# Patient Record
Sex: Female | Born: 1980 | Race: White | Hispanic: No | Marital: Single | Smoking: Never smoker
Health system: Southern US, Community
[De-identification: ages and names within clinical notes are randomized; demographics above are authoritative.]

## PROBLEM LIST (undated history)

## (undated) DIAGNOSIS — K819 Cholecystitis, unspecified: Secondary | ICD-10-CM

## (undated) DIAGNOSIS — N200 Calculus of kidney: Secondary | ICD-10-CM

## (undated) HISTORY — PX: TUBAL LIGATION: SHX77

## (undated) HISTORY — PX: LITHOTRIPSY: SUR834

---
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
## 2001-12-03 ENCOUNTER — Encounter: Payer: Self-pay | Admitting: Internal Medicine

## 2001-12-03 ENCOUNTER — Emergency Department (HOSPITAL_COMMUNITY): Admission: EM | Admit: 2001-12-03 | Discharge: 2001-12-03 | Payer: Self-pay | Admitting: Internal Medicine

## 2001-12-15 ENCOUNTER — Emergency Department (HOSPITAL_COMMUNITY): Admission: EM | Admit: 2001-12-15 | Discharge: 2001-12-15 | Payer: Self-pay | Admitting: Emergency Medicine

## 2002-02-25 ENCOUNTER — Encounter: Payer: Self-pay | Admitting: Emergency Medicine

## 2002-02-25 ENCOUNTER — Emergency Department (HOSPITAL_COMMUNITY): Admission: EM | Admit: 2002-02-25 | Discharge: 2002-02-25 | Payer: Self-pay | Admitting: Emergency Medicine

## 2002-09-24 ENCOUNTER — Emergency Department (HOSPITAL_COMMUNITY): Admission: EM | Admit: 2002-09-24 | Discharge: 2002-09-24 | Payer: Self-pay | Admitting: Emergency Medicine

## 2002-10-18 ENCOUNTER — Observation Stay (HOSPITAL_COMMUNITY): Admission: EM | Admit: 2002-10-18 | Discharge: 2002-10-19 | Payer: Self-pay | Admitting: Internal Medicine

## 2002-10-29 ENCOUNTER — Emergency Department (HOSPITAL_COMMUNITY): Admission: EM | Admit: 2002-10-29 | Discharge: 2002-10-29 | Payer: Self-pay | Admitting: Emergency Medicine

## 2002-11-19 ENCOUNTER — Emergency Department (HOSPITAL_COMMUNITY): Admission: EM | Admit: 2002-11-19 | Discharge: 2002-11-19 | Payer: Self-pay | Admitting: Emergency Medicine

## 2002-12-20 ENCOUNTER — Encounter: Payer: Self-pay | Admitting: *Deleted

## 2002-12-20 ENCOUNTER — Ambulatory Visit (HOSPITAL_COMMUNITY): Admission: RE | Admit: 2002-12-20 | Discharge: 2002-12-20 | Payer: Self-pay | Admitting: *Deleted

## 2003-01-14 ENCOUNTER — Observation Stay (HOSPITAL_COMMUNITY): Admission: AD | Admit: 2003-01-14 | Discharge: 2003-01-15 | Payer: Self-pay | Admitting: *Deleted

## 2003-02-07 ENCOUNTER — Ambulatory Visit (HOSPITAL_COMMUNITY): Admission: AD | Admit: 2003-02-07 | Discharge: 2003-02-07 | Payer: Self-pay | Admitting: *Deleted

## 2003-02-19 ENCOUNTER — Ambulatory Visit (HOSPITAL_COMMUNITY): Admission: RE | Admit: 2003-02-19 | Discharge: 2003-02-19 | Payer: Self-pay | Admitting: *Deleted

## 2003-02-19 ENCOUNTER — Encounter: Payer: Self-pay | Admitting: *Deleted

## 2003-03-31 ENCOUNTER — Emergency Department (HOSPITAL_COMMUNITY): Admission: EM | Admit: 2003-03-31 | Discharge: 2003-03-31 | Payer: Self-pay | Admitting: Emergency Medicine

## 2003-04-20 ENCOUNTER — Ambulatory Visit (HOSPITAL_COMMUNITY): Admission: AD | Admit: 2003-04-20 | Discharge: 2003-04-20 | Payer: Self-pay | Admitting: *Deleted

## 2003-04-29 ENCOUNTER — Inpatient Hospital Stay (HOSPITAL_COMMUNITY): Admission: RE | Admit: 2003-04-29 | Discharge: 2003-05-01 | Payer: Self-pay | Admitting: *Deleted

## 2003-07-06 ENCOUNTER — Ambulatory Visit (HOSPITAL_COMMUNITY): Admission: RE | Admit: 2003-07-06 | Discharge: 2003-07-06 | Payer: Self-pay | Admitting: *Deleted

## 2003-09-18 ENCOUNTER — Emergency Department (HOSPITAL_COMMUNITY): Admission: EM | Admit: 2003-09-18 | Discharge: 2003-09-18 | Payer: Self-pay | Admitting: Emergency Medicine

## 2004-02-22 ENCOUNTER — Emergency Department (HOSPITAL_COMMUNITY): Admission: EM | Admit: 2004-02-22 | Discharge: 2004-02-22 | Payer: Self-pay | Admitting: Emergency Medicine

## 2004-05-22 ENCOUNTER — Emergency Department (HOSPITAL_COMMUNITY): Admission: EM | Admit: 2004-05-22 | Discharge: 2004-05-22 | Payer: Self-pay | Admitting: Emergency Medicine

## 2004-10-23 ENCOUNTER — Emergency Department (HOSPITAL_COMMUNITY): Admission: EM | Admit: 2004-10-23 | Discharge: 2004-10-23 | Payer: Self-pay | Admitting: Emergency Medicine

## 2005-01-15 ENCOUNTER — Emergency Department (HOSPITAL_COMMUNITY): Admission: EM | Admit: 2005-01-15 | Discharge: 2005-01-15 | Payer: Self-pay | Admitting: *Deleted

## 2005-03-24 ENCOUNTER — Emergency Department (HOSPITAL_COMMUNITY): Admission: EM | Admit: 2005-03-24 | Discharge: 2005-03-24 | Payer: Self-pay | Admitting: Emergency Medicine

## 2005-04-08 ENCOUNTER — Emergency Department (HOSPITAL_COMMUNITY): Admission: EM | Admit: 2005-04-08 | Discharge: 2005-04-08 | Payer: Self-pay | Admitting: *Deleted

## 2005-05-25 ENCOUNTER — Emergency Department (HOSPITAL_COMMUNITY): Admission: EM | Admit: 2005-05-25 | Discharge: 2005-05-25 | Payer: Self-pay | Admitting: Emergency Medicine

## 2006-05-11 ENCOUNTER — Emergency Department (HOSPITAL_COMMUNITY): Admission: EM | Admit: 2006-05-11 | Discharge: 2006-05-11 | Payer: Self-pay | Admitting: Emergency Medicine

## 2006-07-08 ENCOUNTER — Emergency Department (HOSPITAL_COMMUNITY): Admission: EM | Admit: 2006-07-08 | Discharge: 2006-07-08 | Payer: Self-pay | Admitting: Emergency Medicine

## 2006-12-06 ENCOUNTER — Emergency Department (HOSPITAL_COMMUNITY): Admission: EM | Admit: 2006-12-06 | Discharge: 2006-12-06 | Payer: Self-pay | Admitting: Emergency Medicine

## 2007-03-20 ENCOUNTER — Emergency Department (HOSPITAL_COMMUNITY): Admission: EM | Admit: 2007-03-20 | Discharge: 2007-03-20 | Payer: Self-pay | Admitting: Emergency Medicine

## 2007-07-30 ENCOUNTER — Emergency Department (HOSPITAL_COMMUNITY): Admission: EM | Admit: 2007-07-30 | Discharge: 2007-07-30 | Payer: Self-pay | Admitting: Emergency Medicine

## 2007-08-06 ENCOUNTER — Emergency Department (HOSPITAL_COMMUNITY): Admission: EM | Admit: 2007-08-06 | Discharge: 2007-08-07 | Payer: Self-pay | Admitting: Emergency Medicine

## 2007-08-15 ENCOUNTER — Emergency Department (HOSPITAL_COMMUNITY): Admission: EM | Admit: 2007-08-15 | Discharge: 2007-08-15 | Payer: Self-pay | Admitting: Emergency Medicine

## 2007-12-28 ENCOUNTER — Emergency Department (HOSPITAL_COMMUNITY): Admission: EM | Admit: 2007-12-28 | Discharge: 2007-12-28 | Payer: Self-pay | Admitting: Emergency Medicine

## 2008-01-13 ENCOUNTER — Emergency Department (HOSPITAL_COMMUNITY): Admission: EM | Admit: 2008-01-13 | Discharge: 2008-01-14 | Payer: Self-pay | Admitting: Emergency Medicine

## 2008-03-02 ENCOUNTER — Emergency Department (HOSPITAL_COMMUNITY): Admission: EM | Admit: 2008-03-02 | Discharge: 2008-03-02 | Payer: Self-pay | Admitting: Emergency Medicine

## 2008-06-14 ENCOUNTER — Emergency Department (HOSPITAL_COMMUNITY): Admission: EM | Admit: 2008-06-14 | Discharge: 2008-06-14 | Payer: Self-pay | Admitting: Emergency Medicine

## 2008-09-03 ENCOUNTER — Emergency Department (HOSPITAL_COMMUNITY): Admission: EM | Admit: 2008-09-03 | Discharge: 2008-09-03 | Payer: Self-pay | Admitting: Emergency Medicine

## 2009-03-15 ENCOUNTER — Emergency Department (HOSPITAL_COMMUNITY): Admission: EM | Admit: 2009-03-15 | Discharge: 2009-03-15 | Payer: Self-pay | Admitting: Emergency Medicine

## 2009-07-18 ENCOUNTER — Emergency Department (HOSPITAL_COMMUNITY): Admission: EM | Admit: 2009-07-18 | Discharge: 2009-07-18 | Payer: Self-pay | Admitting: Emergency Medicine

## 2010-02-19 ENCOUNTER — Emergency Department (HOSPITAL_COMMUNITY): Admission: EM | Admit: 2010-02-19 | Discharge: 2010-02-19 | Payer: Self-pay | Admitting: Emergency Medicine

## 2010-02-28 ENCOUNTER — Ambulatory Visit (HOSPITAL_COMMUNITY): Admission: RE | Admit: 2010-02-28 | Discharge: 2010-02-28 | Payer: Self-pay | Admitting: Urology

## 2010-03-05 ENCOUNTER — Emergency Department (HOSPITAL_COMMUNITY): Admission: EM | Admit: 2010-03-05 | Discharge: 2010-03-05 | Payer: Self-pay | Admitting: Emergency Medicine

## 2010-03-07 ENCOUNTER — Emergency Department (HOSPITAL_COMMUNITY): Admission: EM | Admit: 2010-03-07 | Discharge: 2010-03-07 | Payer: Self-pay | Admitting: Emergency Medicine

## 2010-03-10 ENCOUNTER — Ambulatory Visit (HOSPITAL_COMMUNITY): Admission: RE | Admit: 2010-03-10 | Discharge: 2010-03-10 | Payer: Self-pay | Admitting: Urology

## 2010-03-15 ENCOUNTER — Emergency Department (HOSPITAL_COMMUNITY): Admission: EM | Admit: 2010-03-15 | Discharge: 2010-03-16 | Payer: Self-pay | Admitting: Emergency Medicine

## 2010-03-19 ENCOUNTER — Emergency Department (HOSPITAL_COMMUNITY): Admission: EM | Admit: 2010-03-19 | Discharge: 2010-03-19 | Payer: Self-pay | Admitting: Emergency Medicine

## 2010-04-23 ENCOUNTER — Ambulatory Visit (HOSPITAL_COMMUNITY): Admission: RE | Admit: 2010-04-23 | Discharge: 2010-04-23 | Payer: Self-pay | Admitting: Urology

## 2010-04-24 ENCOUNTER — Ambulatory Visit (HOSPITAL_COMMUNITY): Admission: RE | Admit: 2010-04-24 | Discharge: 2010-04-24 | Payer: Self-pay | Admitting: Urology

## 2010-09-20 ENCOUNTER — Emergency Department (HOSPITAL_COMMUNITY): Admission: EM | Admit: 2010-09-20 | Discharge: 2010-09-20 | Payer: Self-pay | Admitting: Emergency Medicine

## 2010-09-26 IMAGING — CR DG ABDOMEN 1V
1 series · 1 of 1 positions shown · non-contrast
Comparison: [facility] CT scan of the abdomen 03/05/2010

CLINICAL DATA: Right renal calculus

ABDOMEN - 1 VIEW

[view not recorded]
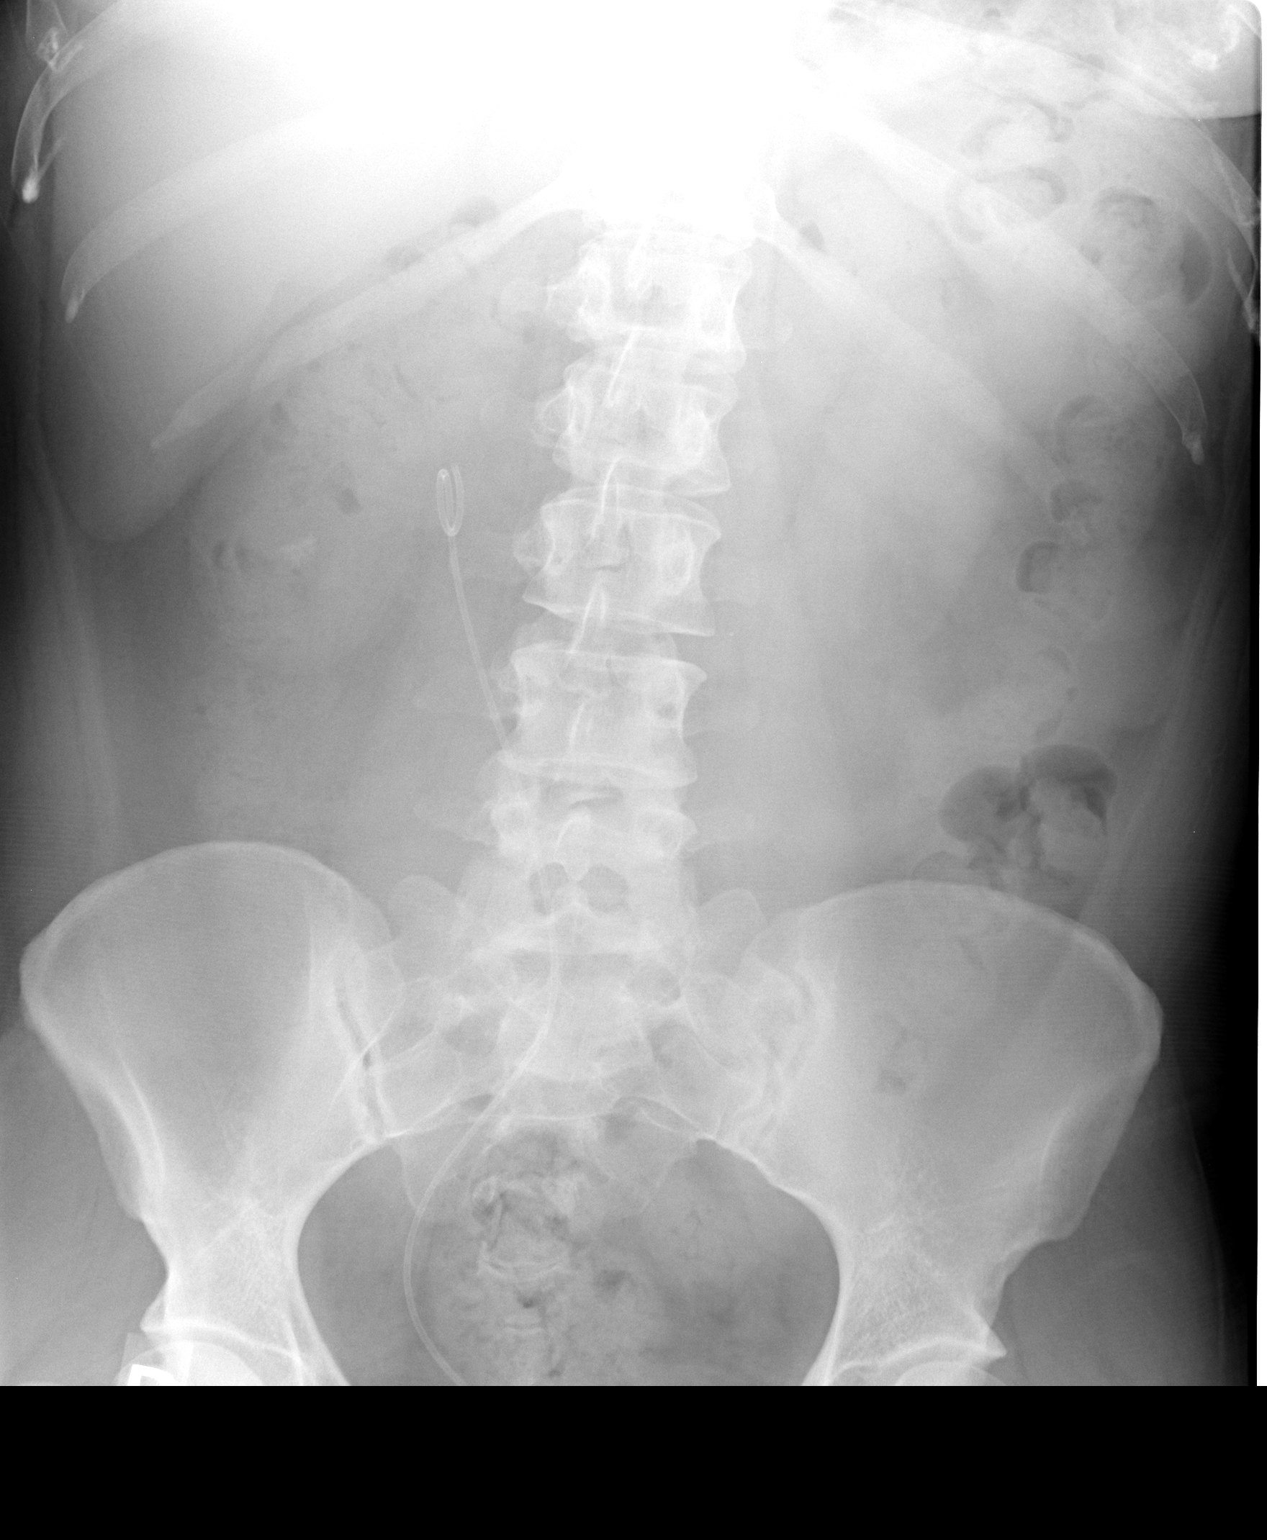

[1 of 1 positions shown; findings below may reference images not displayed]

FINDINGS: There is a nonspecific nonobstructive bowel gas pattern.
Stool noted throughout the colon.  There is a right ureteral stent
in place.  A calcification in the lower pole region of the right
kidney measures about 8.4 mm.
IMPRESSION: Right ureteral stent in place.  Calcification in the lower pole
region of the right kidney measures 8.4 mm.

## 2011-01-11 ENCOUNTER — Encounter: Payer: Self-pay | Admitting: Urology

## 2011-03-15 LAB — DIFFERENTIAL
Basophils Relative: 0 % (ref 0–1)
Eosinophils Absolute: 0.2 10*3/uL (ref 0.0–0.7)
Lymphocytes Relative: 39 % (ref 12–46)
Lymphs Abs: 2.8 10*3/uL (ref 0.7–4.0)
Monocytes Relative: 4 % (ref 3–12)
Monocytes Relative: 7 % (ref 3–12)
Neutrophils Relative %: 53 % (ref 43–77)
Neutrophils Relative %: 68 % (ref 43–77)

## 2011-03-15 LAB — URINE CULTURE: Culture: NO GROWTH

## 2011-03-15 LAB — URINALYSIS, ROUTINE W REFLEX MICROSCOPIC
Bilirubin Urine: NEGATIVE
Glucose, UA: NEGATIVE mg/dL
Ketones, ur: NEGATIVE mg/dL
Nitrite: POSITIVE — AB
Protein, ur: 100 mg/dL — AB
Protein, ur: 100 mg/dL — AB
Protein, ur: NEGATIVE mg/dL
Specific Gravity, Urine: 1.03 (ref 1.005–1.030)
Specific Gravity, Urine: >1.03 — ABNORMAL HIGH (ref 1.005–1.030)
Urobilinogen, UA: 0.2 mg/dL (ref 0.0–1.0)
Urobilinogen, UA: 0.2 mg/dL (ref 0.0–1.0)
pH: 6 (ref 5.0–8.0)
pH: 6.5 (ref 5.0–8.0)

## 2011-03-15 LAB — POCT I-STAT, CHEM 8
Glucose, Bld: 85 mg/dL (ref 70–99)
HCT: 39 % (ref 36.0–46.0)
Hemoglobin: 13.3 g/dL (ref 12.0–15.0)
Potassium: 3.8 mEq/L (ref 3.5–5.1)
Sodium: 137 mEq/L (ref 135–145)

## 2011-03-15 LAB — BASIC METABOLIC PANEL
BUN: 11 mg/dL (ref 6–23)
BUN: 14 mg/dL (ref 6–23)
CO2: 28 mEq/L (ref 19–32)
Calcium: 9.3 mg/dL (ref 8.4–10.5)
Creatinine, Ser: 0.6 mg/dL (ref 0.4–1.2)
Creatinine, Ser: 0.66 mg/dL (ref 0.4–1.2)
GFR calc Af Amer: >60 mL/min (ref >60)
GFR calc Af Amer: >60 mL/min (ref >60)
GFR calc non Af Amer: >60 mL/min (ref >60)
Potassium: 3.7 mEq/L (ref 3.5–5.1)

## 2011-03-15 LAB — CBC
HCT: 39.6 % (ref 36.0–46.0)
MCHC: 35.2 g/dL (ref 30.0–36.0)
MCV: 86.8 fL (ref 78.0–100.0)
Platelets: 290 10*3/uL (ref 150–400)
Platelets: 380 10*3/uL (ref 150–400)
RBC: 4.46 MIL/uL (ref 3.87–5.11)
RDW: 13.1 % (ref 11.5–15.5)
WBC: 7.4 10*3/uL (ref 4.0–10.5)

## 2011-03-15 LAB — URINE MICROSCOPIC-ADD ON

## 2011-03-15 LAB — PREGNANCY, URINE: Preg Test, Ur: NEGATIVE

## 2011-03-29 LAB — URINALYSIS, ROUTINE W REFLEX MICROSCOPIC
Bilirubin Urine: NEGATIVE
Glucose, UA: NEGATIVE mg/dL
Ketones, ur: NEGATIVE mg/dL
Protein, ur: NEGATIVE mg/dL

## 2011-03-29 LAB — URINE MICROSCOPIC-ADD ON

## 2011-04-02 LAB — URINALYSIS, ROUTINE W REFLEX MICROSCOPIC
Ketones, ur: NEGATIVE mg/dL
Urobilinogen, UA: 0.2 mg/dL (ref 0.0–1.0)

## 2011-04-02 LAB — URINE CULTURE: Colony Count: >=100000

## 2011-04-02 LAB — PREGNANCY, URINE: Preg Test, Ur: NEGATIVE

## 2011-04-02 LAB — URINE MICROSCOPIC-ADD ON

## 2011-05-08 NOTE — Consult Note (Signed)
   NAME:  Robin Garrett, Robin Garrett                            ACCOUNT NO.:  [id number]   MEDICAL RECORD NO.:  [id number]                   PATIENT TYPE:  OIB   LOCATION:  A415                                 FACILITY:  [facility]   PHYSICIAN:  Lazaro Arms, M.D.                DATE OF BIRTH:  1981/01/16   DATE OF CONSULTATION:  01/14/2003  DATE OF DISCHARGE:                                   CONSULTATION   HISTORY OF PRESENT ILLNESS:  This patient is a 30 year old white female,  gravida 4, para 3, estimated date of delivery of May 18, 2003, currently at  [redacted] weeks gestation who presents complaining of some red spotting today when  she urinated and wiped. Nothing heavier than that.  She has not had  intercourse in over a week.  The patient states she no urinary complaints  and she is without any complaints of pain, and reports feeling fetal  movement occasionally.   On reviewing her ultrasound, which was done at approximately [redacted] weeks  gestation it was noted that she had an anterior placenta previa.  As a  result I went and performed an ultrasound as well this evening.  Ultrasound  reveals a singleton intrauterine pregnancy that is active with the fetal  heart rate approximately 160, it is in the breech presentation, it is a  female, and it is moving all extremities well.  The amniotic fluid volume is  normal.  There is an anterior placenta previa.  It is a complete previa.  It  completely covers the cervical os in the anterior to posterior direction.  There is no active bleeding in the area of the cervix seen and there is good  cervical length by ultrasound.   As a result the patient will kept overnight for observation and to make sure  that her bleeding does not worsen; and, also will be given further  instructions regarding her placenta previa as she goes home.                                                 Lazaro Arms, M.D.    Loraine Maple  D:  01/14/2003  T:  01/15/2003  Job:  [id number]

## 2011-05-08 NOTE — H&P (Signed)
NAME:  Robin Garrett, Robin Garrett                            ACCOUNT NO.:  [id number]   MEDICAL RECORD NO.:  [id number]                   PATIENT TYPE:  AMB   LOCATION:  DAY                                  FACILITY:  [facility]   PHYSICIAN:  Langley Gauss, M.D.                DATE OF BIRTH:  09/05/1981   DATE OF ADMISSION:  07/06/2003  DATE OF DISCHARGE:                                HISTORY & PHYSICAL   HISTORY OF PRESENT ILLNESS:  This is a 30 year old, gravida 4, para 4,  referred through outpatient surgery for D&C as well as laparoscopic tubal  ligation utilizing bipolar cautery.  The patient is postpartum OB Apr 29, 2003, following uncomplicated vaginal delivery.  She states she has been  bleeding on a daily basis since then with passage of some small clots but at  no given time has her bleeding stopped.  She was two weeks previously  treated empirically for endometritis with Methergine 0.2 mg p.o. b.i.d. x3  days as well as doxycycline 100 mg p.o. b.i.d. x10 days.  She states that  that resulted in uterine cramping but failed to give any significant relief  from the bleeding.  Thus, D&C will be done in an effort to search for a  cause for her postpartum bleeding.  The patient also desires permanent  sterilization.  She has waited the appropriate 30-day waiting period prior  to performance of the procedure.  She does have four prior vaginal  deliveries.  The patient is aware that there are other reversible forms of  birth control available.  She is aware that tubal ligation is to be  considered permanent and irreversible, and there is a 2% failure rate  associated with the procedure.   PAST MEDICAL HISTORY:  The patient did have frequent urinary tract  infections during her pregnancy.  She had pyelonephritis with her first  pregnancy.   PAST OBSTETRICAL HISTORY:  Four prior vaginal deliveries without  complication.   PAST SURGICAL HISTORY:  She has no surgical history.   ALLERGIES:  No  known drug allergies.   CURRENT MEDICATIONS:  None.   PHYSICAL EXAMINATION:  VITAL SIGNS:  Height 4 feet 10 inches, weight 142.  Blood pressure 123/74, pulse of 80, respiratory rate of 20.  HEENT:  Negative.  No adenopathy.  NECK:  Supple.  Thyroid is not palpable.  LUNGS:  Clear.  CARDIOVASCULAR:  Regular rate and rhythm.  ABDOMEN:  Soft and nontender.  No surgical scars are identified.  No  abdominal or pelvic masses are appreciated.  EXTREMITIES:  Noted to be normal.  PELVIC:  Normal external genitalia.  No lesions or ulcerations identified.  No active bleeding is identified.  The uterus is noted to be normal size,  anteverted, freely mobile, and all pelvic structures are freely mobile.   LABORATORY STUDIES:  Hemoglobin in the office is 11.9.  ASSESSMENT:  Postpartum vaginal bleeding.  Will proceed with D&C to assess  whether any retained products are present.  The patient will be treated  preoperatively with 1 g of IV Ancef.  Following this will proceed with  laparoscopic tubal ligation utilizing bipolar cautery.  The risks and  benefits of the above procedures were discussed with the patient to include  risk of bowel or vascular injury.                                               Langley Gauss, M.D.    DC/MEDQ  D:  07/06/2003  T:  07/06/2003  Job:  [id number]

## 2011-05-08 NOTE — Op Note (Signed)
   NAMEHAYLA, HINGER                              ACCOUNT NO.:  [id number]   MEDICAL RECORD NO.:  [id number]                  PATIENT TYPE:   LOCATION:                                       FACILITY:   PHYSICIAN:  Langley Gauss, M.D.                DATE OF BIRTH:   DATE OF PROCEDURE:  DATE OF DISCHARGE:                                 OPERATIVE REPORT   OBSTETRICAL PROCEDURE NOTE:   PROCEDURE:  Placement of continuous lumbar epidural analgesia at the L3-L4  interspace performed by Dr. Roylene Reason. Lisette Grinder.   COMPLICATIONS:  None.   SUMMARY:  An appropriate and informed consent was obtained.  The patient had  had epidural placed with 2 prior labor and deliveries.  Continuous  electronic fetal monitoring was performed.  The patient was placed in the  seated position; bony landmarks were identified.  The L3-L4 interspace was  selected; 5 cc of 1% lidocaine plain injected at the site to raise a small  skin wheal.   The 17-gauge Touhy-Schliff needle was then utilized with loss of resistance  in air-filled glass syringe.  On the first two attempts the bony spinous  process was encountered, thus epidural needle was withdrawn and directed  slightly cephalad. On this third attempt there is excellent loss of  resistance consistent with entry into the epidural space without difficulty.  Initial test dose of 5 cc of 1.5 lidocaine plus epinephrine inserted without  difficulty.  The catheter is inserted to a depth of 5 cm.  The needle is  removed.  Aspiration test is negative.  The patient subsequently received a  bolus of 5 cc of 2% lidocaine to set up the epidural block.  The patient  continues to have reassuring fetal heart rate.  She is having warmth in both  legs consistent with the properly setting up epidural block.  No signs of  CSF or intravascular injection obtained.   After catheter is secured into place, the patient is connected to pump,  containing the standard mixture.  She will  be treated with a bolus of 10 cc  followed by continuous infusion rate of 12 cc/hour.  Immediately following  placement the patient is examined.  She is noted to be 6 cm dilated, 60%  effaced with the vertex at a 0 station.  The patient continues to have a  reassuring fetal heart rate.  Pelvis is noted to be clinically adequate.                                               Langley Gauss, M.D.    DC/MEDQ  D:  04/29/2003  T:  04/29/2003  Job:  [id number]

## 2011-05-08 NOTE — Discharge Summary (Signed)
   NAME:  Robin Garrett, Robin Garrett                            ACCOUNT NO.:  [id number]   MEDICAL RECORD NO.:  [id number]                   PATIENT TYPE:  INP   LOCATION:  A417                                 FACILITY:  [facility]   PHYSICIAN:  Langley Gauss, M.D.                DATE OF BIRTH:  [birth year]   DATE OF ADMISSION:  04/29/2003  DATE OF DISCHARGE:  05/01/2003                                 DISCHARGE SUMMARY   DIAGNOSIS:  A 38-2/7 weeks intrauterine pregnancy, presenting in labor.   PROCEDURES:  1. On 04/29/2003, placement of continuous lumbar epidural analgesia.  2. On 04/29/2003, spontaneous assisted vaginal delivery of a 5-pound-13-     ounce female infant, delivered over an intact perineum.  3. Infant circumcision was performed on 04/30/2003 prior to the day of     discharge.   PERTINENT LABORATORY STUDIES:  Admission hemoglobin and hematocrit 13.2/37.7  with a white count of 13.9, O positive blood type.  On postpartum day number  one, 11.2/32.3 with a white count of 11.6.  Patient does desire a permanent  sterilization when she is greater than 33 years old.  Patient is bottle  feeding at time of discharge.   DISCHARGE MEDICATIONS:  1. Include HCTZ 25 mg p.o. daily p.r.n. fluid retention, number 30 with 1     refill.  2. Tylox one every four hours p.r.n. for postpartum pain and back pain,     number 20 with no refill.  3. Motrin 400 mg p.o. q.i.d. p.r.n., number 30 with no refill, for     postpartum pain.   HOSPITAL COURSE:  See previous dictations.  Patient was admitted in the very  early morning of 04/29/2003 in active labor.  Thereafter, patient was noted  to progress rapidly along the labor curve.  She did request epidural  analgesic, which was placed without complications and functioned well  throughout the remainder of the labor course.  At complete dilatation,  patient was placed in the dorsolithotomy position, prepped and draped in the  usual sterile manner; she delivered in  an uneventful manner.  She did very  well following delivery, had no excessive vaginal bleeding, she bonded well  with the infant, and thus was discharged home on postpartum day number two.                                               Langley Gauss, M.D.    DC/MEDQ  D:  04/30/2003  T:  05/01/2003  Job:  [id number]

## 2011-05-08 NOTE — Op Note (Signed)
NAMEAMILY, DEPP                              ACCOUNT NO.:  [id number]   MEDICAL RECORD NO.:  [id number]                  PATIENT TYPE:   LOCATION:                                       FACILITY:   PHYSICIAN:  Langley Gauss, M.D.                DATE OF BIRTH:   DATE OF PROCEDURE:  DATE OF DISCHARGE:                                 OPERATIVE REPORT   DELIVERY NOTE:   DIAGNOSES:  A 38-2/7 weeks intrauterine pregnancy with spontaneous rupture  of membranes presenting in active labor.   PROCEDURES:  1. Placement of continuous lumbar epidural analgesia.  2. Spontaneous assisted vaginal delivery of a 5 pound 13 ounce female infant     delivered over an intact perineum.   Delivery performed by Dr. Roylene Reason. Lisette Grinder.   ESTIMATED BLOOD LOSS:  Less than 500 cc.   COMPLICATIONS:  None.   SPECIMENS:  1. Cord blood to laboratory for analysis.  I was unable to obtain an     arterial cord blood sample.  2. Placenta is examined and noted to be apparently intake with a 3-vessel     umbilical cord.   SUMMARY:  The patient presented to [facility] with spontaneous  rupture of membranes; noted to be in active labor upon examination.  Initial  examination by the nursing staff: 4 cm dilated with obvious rupture of  membranes.  Thereafter, the patient was noted to progress normally along the  labor curve with spontaneous labor.  With onset of uncomfortable uterine  contractions the patient did request epidural analgesia.  The epidural was  placed without difficulty and functions for the remainder of the labor very  well.   The patient was noted in the early a.m. to have significant increase in the  pelvic pressure.  She was examined by the nursing staff at that time and  noted to still remain at 7 cm.  The patient was treated with 10 mg of IV  Nubain at that time.  Very shortly thereafter she progressed rapidly to  complete dilatation and was placed in the dorsal lithotomy position  and had  good urge to push.  Foley catheter was removed.  Patient sterilely prepped  and draped utilizing the standard delivery setup.   The patient thereafter pushed well during this short second stage with  descent of the vertex to the perineal floor.  Excellent perineal support was  provided at the time of delivery resulting in delivery in a OA position over  an intact perineum.   The mouth and nares were bulb suctioned of clear amniotic fluid.  Renewed  expulsive efforts resulted in a spontaneous rotation to a right anterior  shoulder position followed by gentle traction and expulsive efforts this  shoulder delivered beneath the pubic symphysis without difficulty.  The  umbilical cord was milked towards the infant.  The cord was doubly clamped,  and cut.  The infant is placed on the maternal abdomen for immediate bonding  purposes.  I was unable to obtain an arterial cord sample following  delivery.  I was able to obtain a venous cord sample.   Gentle traction on the umbilical cord results in separation which, upon  examination, appears to be an intact placenta with an associated 3-vessel  cord.  Examination of the genital tract reveals no lacerations, specifically  the perineum is noted to be intact.  Excellent uterine tone is achieved  utilizing IV Pitocin solution.  The patient is then taken out of the  lithotomy position and rolled to her side, at which time the epidural  catheter is removed with the blue tip noted to be intact.                                               Langley Gauss, M.D.    DC/MEDQ  D:  04/29/2003  T:  04/29/2003  Job:  [id number]

## 2011-05-08 NOTE — Op Note (Signed)
NAME:  Robin Garrett, Robin Garrett                            ACCOUNT NO.:  [id number]   MEDICAL RECORD NO.:  [id number]                   PATIENT TYPE:  AMB   LOCATION:  DAY                                  FACILITY:  [facility]   PHYSICIAN:  Langley Gauss, M.D.                DATE OF BIRTH:  1981-10-24   DATE OF PROCEDURE:  DATE OF DISCHARGE:  07/06/2003                                 OPERATIVE REPORT   DATE OF PROCEDURE:  July 06, 2003   PROCEDURE PERFORMED:  D&C, laparoscopic tubal ligation utilizing bipolar  cautery.   PREOPERATIVE DIAGNOSES:  Postpartum vaginal bleeding, desires permanent  sterilization.   SURGEON:  Roylene Reason. Lisette Grinder, M.D.   ESTIMATED BLOOD LOSS:  Minimal.   SPECIMENS:  Uterine curettings only.   ANESTHESIA:  General endotracheal.   FINDINGS AT TIME OF SURGERY:  Normal size uterus with a regular intrauterine  contour and no significant tissue obtained.  Laparoscopy revealed a normal  size uterus, no adnexal masses, tubes normal in appearance.  Surgeon: Dr.  Roylene Reason. Lisette Grinder.   SUMMARY:  The patient taken into the operating room.  Vital signs stable.  The patient underwent uncomplicated induction of general endotracheal  anesthesia.  She then was placed in the dorsolithotomy position, prepped and  draped in the usual sterile manner.  Red Rubber Catheter used to obtain 50  mL of clear yellow urine from the bladder.  Speculum placed in the vaginal  vault.  Cervix was visualized.  No active bleeding at present.  Cervix is  without lesions.  Anterior lip is grasped with a single-tooth tenaculum.  Uterus is noted to sound to a depth of 8 cm.  Progressive dilatation is then  performed up to a size #25 dilator which allows passage of a small curette  into the uterine cavity.  Aggressive curettage was then performed of all  quadrants of the uterine cavity until a gritty sensation was appreciated in  all quadrants.  No significant tissue was obtained.  The D&C is thus  completed.  The tenaculum is removed. And a Hulka intrauterine manipulator  was used to grasp the cervix at 12 o'clock.  I then changed to sterile  gloves standing at the patient's left side and 1 cm vertical incision made  just inferior to the umbilicus, likewise a suprapubic midline incision is  obtained.  The abdominal wall was then elevated.  Veress needle was then  passed through the subumbilical incision while elevating the abdominal wall.  This is done atraumatically.  Drop test confirms an adequate intraperitoneal  placement.  Pneumoperitoneum is then created by insufflating __________ of  CO2 gas at a filling pressure of less than 15 mmHg.  The Veress needle is  then removed.  Abdominal wall was again elevated.  The clear disposable 10  mm trochar was inserted through the subumbilical incision angling towards  the hollow of the  sacrum.  This is done atraumatically.  Trochar is removed.  Scope is inserted which confirms proper intraperitoneal placement.  No  apparent bowel or vascular injury noted.  Pelvic contents is visualized and  described previously.  Under direct visualization a 5 mm trocar was used to  insert a second port suprapubic line under direct visualization.  This  allows passage of the Kleppinger bipolar forceps and cauterization of the  tubes bilaterally.  A total of three cauterizations were performed on each  fallopian tube with the most proximal being 1 cm from the tubouterine  junction and all cauterizations being in direct continuity with the  previous.  This resulted in extensive tubular destruction bilaterally with  destruction of at least 3 cm of fallopian tube.  At all times the  cauterizing current was continued until it plateaued down to 2 or 3 LAD  lights, this resulted in extensive tubular destruction, there were no  complications occurring, thus our instruments are removed and gas allowed to  escape and our incision sites are closed utilizing a 2-0  Vicryl suture  through the deep fascial layer, the skin is noted to reapproximate  spontaneously following this.  Our speculum and Hulka manipulator were then  removed from the vaginal vault.  The patient is taken to recovery room  following extubation in stable condition.  Current laboratory studies: hCG  negative, hemoglobin 12.3, hematocrit 36.5, white count of 8.2.  If the  patient does well in recovery room she should be discharged to home today.  She will be given __________ discharge instructions.  I have contacted [facility] and called in Lortab 10/500 #20 with no refill for postoperative pain.   DISPOSITION:  The patient given copy of standard discharge instruction and  should be following up in our office 1 week's time at which time the  pathology from the uterine curettings will be available.                                               Langley Gauss, M.D.    DC/MEDQ  D:  07/06/2003  T:  07/07/2003  Job:  [id number]

## 2011-05-08 NOTE — H&P (Signed)
NAME:  Robin Garrett, Robin Garrett                            ACCOUNT NO.:  [id number]   MEDICAL RECORD NO.:  [id number]                   PATIENT TYPE:  INP   LOCATION:  A417                                 FACILITY:  [facility]   PHYSICIAN:  Langley Gauss, M.D.                DATE OF BIRTH:  12/22/1980   DATE OF ADMISSION:  04/29/2003  DATE OF DISCHARGE:                                HISTORY & PHYSICAL   HISTORY OF PRESENT ILLNESS:  This is a 30 year old gravida 4, para 3 with  spontaneous rupture of membranes at 2:20 a.m. on 04/29/2003.  The patient  presented to [facility] thereafter in early stages of active labor  with initial examination 4 cm dilated.  The patient's prenatal course had  been uncomplicated.  She does have a history of pyelonephritis with previous  pregnancies, thus, during this pregnancy she was maintained on Macrodantin  suppressive therapy 100 mg p.o. q.h.s.   SOCIAL HISTORY:  The patient is a nonsmoker.   OBSTETRICAL HISTORY:  She has had 3 prior vaginal deliveries without  complications.   ALLERGIES:  No known drug allergies.   CURRENT MEDICATIONS:  Prenatal vitamins only   PHYSICAL EXAMINATION:  GENERAL:  A pleasant, white female.  Height 4  feet  11 inches.  Weight is 154 pounds which represents a 24-pound weight gain  during the pregnancy.  VITAL SIGNS:  Blood pressure 145/71, pulse rate of 80, respiratory rate is  20.  HEENT:  Negative.  NECK:  No adenopathy.  Neck is supple.  LUNGS:  Lungs are clear.  CARDIOVASCULAR:  Exam is regular rate and rhythm.  ABDOMEN:  The abdomen is soft and nontender.  No surgical scars are  identified.  PELVIC:  She has vertex presentation by Leopold's maneuvers.  EXTREMITIES:  Extremities are noted to be normal.   LABOR:  External fetal monitor reveals reassuring fetal heart rate with  accelerations noted. Uterine contractions are occurring q.3-5 mins.  Examination reveals gross obvious rupture of membranes.  The  patient  examined by the nursing staff and noted to be 4 cm dilatation.  There after  she was noted to progress rapidly to 6 cm dilatation.   ASSESSMENT:  A 38-2/7 weeks intrauterine pregnancy in a multiparous patient.  The patient admitted in early stages of active labor with evidence of  rupture of membranes.  The patient desires epidural analgesic which will be  placed at this time.  Thereafter the contractions and frequency to be  assessed to see whether augmentation or Pitocin injection would be  clinically indicated.                                              Langley Gauss, M.D.   DC/MEDQ  D:  04/29/2003  T:  04/29/2003  Job:  [id number]

## 2011-05-08 NOTE — Discharge Summary (Signed)
NAME:  Robin Garrett, Robin Garrett                            ACCOUNT NO.:  [id number]   MEDICAL RECORD NO.:  [id number]                   PATIENT TYPE:  OIB   LOCATION:  A415                                 FACILITY:  [facility]   PHYSICIAN:  Langley Gauss, M.D.                DATE OF BIRTH:  25-Jun-1981   DATE OF ADMISSION:  02/07/2003  DATE OF DISCHARGE:                                 DISCHARGE SUMMARY   OBSTETRICAL OBSERVATION NOTE:   HISTORY OF PRESENT ILLNESS:  A 30 year old gravida 4, para 3 at 26-2/[redacted] weeks  gestation.  The patient presents to [facility] stating that she had  a fall earlier this afternoon.  The patient was at her home, walking down a  small flight of stairs at which time she does recall bumping against the  wall and then probably had a forward summersault going down a total of about  6 stairs.  This was witnessed by her children.  She did not pass out or  loose any consciousness, but she is somewhat uncertain regarding how the  fall occurred.  She does, however, states that she did not hit her abdomen  or uterus during the fall.  She denies any vaginal bleeding or abdominal  cramping, and continues to describe good fetal movement.  She, however,  describes some bilateral inguinal sharp, pulling pain.   The patient's prenatal course has been complicated by one previous visit to  labor and delivery for some very light vaginal bleeding.  Initial impression  was that of a placenta previa, but previous and subsequent ultrasounds  revealed anterior placenta with no lying or no placental previa.   PAST OBSTETRICAL HISTORY:  She does have 3 prior vaginal deliveries without  complications.  Currently 26-2/[redacted] weeks gestation.   PAST MEDICAL HISTORY, SOCIAL HISTORY, AND PAST FAMILY HISTORY:  Unchanged  from previous obstetrical record which is reviewed.   PHYSICAL EXAMINATION:  GENERAL:  In no acute distress.  VITAL SIGNS:  97.4, pulse 97, respiratory rate 20, blood pressure  123/75.  ABDOMEN:  Uterus soft and nontender consistent with [redacted] weeks gestation.  Fundal height of 26.  No abdominal, pelvic, or upper extremity or lower  extremity bruising is noted.  There is no significant evidence of any  trauma.  There is no external bleeding noted.  No lesions or ulcerations on  the external genitalia.  PELVIC:  Cervix is noted to be closed with a presenting part very high and  not engaged.  No effacement is noted.  No vaginal bleeding, leakage of fluid  or abnormal discharge identified on the examining hand.   X-RAY AND LABORATORY DATA:  External fetal monitor reveals heart rate  baseline of 150.  There are accelerations noted of 10 beats/minute x15  seconds duration. No fetal heart rate decelerations are noted.  Long term  variability is noted to be normal for [redacted] weeks  gestation.  External toco  reveals no uterine activity identified.   Laboratory studies:  A UA is performed which reveals urine specific gravity  to be concentrated at 1.030, negative nitrites, negative esterase, negative  white cells.   ASSESSMENT:  A 26 week intrauterine pregnancy sustaining some minimal  abdominal trauma following a fall.  Fetal status is reassuring at present  with appropriate fetal heart rate tracing for [redacted] weeks gestation.  The  patient was treated with 2 p.o. Tylox which did completely resolve the  bilateral inguinal-type pulling pain which is probably a muscular injury  related to the fall.  The patient is thus discharged home at this time.  She  is given a prescription for Lortab 10/500, #20, with no refill to take  should the muscular pain recur.                                               Langley Gauss, M.D.    DC/MEDQ  D:  02/07/2003  T:  02/07/2003  Job:  [id number]

## 2011-09-14 LAB — PREGNANCY, URINE: Preg Test, Ur: NEGATIVE

## 2011-09-14 LAB — URINALYSIS, ROUTINE W REFLEX MICROSCOPIC
Bilirubin Urine: NEGATIVE
Glucose, UA: NEGATIVE
Ketones, ur: NEGATIVE
Nitrite: NEGATIVE
Protein, ur: NEGATIVE
pH: 5

## 2011-09-14 LAB — STREP A DNA PROBE: Group A Strep Probe: NEGATIVE

## 2011-09-14 LAB — URINE MICROSCOPIC-ADD ON

## 2011-09-22 ENCOUNTER — Encounter: Payer: Self-pay | Admitting: *Deleted

## 2011-09-22 DIAGNOSIS — R109 Unspecified abdominal pain: Secondary | ICD-10-CM | POA: Insufficient documentation

## 2011-09-22 DIAGNOSIS — R350 Frequency of micturition: Secondary | ICD-10-CM | POA: Insufficient documentation

## 2011-09-22 DIAGNOSIS — R110 Nausea: Secondary | ICD-10-CM | POA: Insufficient documentation

## 2011-09-22 DIAGNOSIS — Z87442 Personal history of urinary calculi: Secondary | ICD-10-CM | POA: Insufficient documentation

## 2011-09-22 LAB — URINALYSIS, ROUTINE W REFLEX MICROSCOPIC
Glucose, UA: NEGATIVE mg/dL
Hgb urine dipstick: NEGATIVE
Ketones, ur: NEGATIVE mg/dL
Protein, ur: NEGATIVE mg/dL
Urobilinogen, UA: 0.2 mg/dL (ref 0.0–1.0)

## 2011-09-22 LAB — URINE MICROSCOPIC-ADD ON

## 2011-09-22 NOTE — ED Notes (Signed)
C/o right flank pain onset 1700; hx of kidney stone; reports urinary frequency; c/o nausea, denies v/d

## 2011-09-23 ENCOUNTER — Emergency Department (HOSPITAL_COMMUNITY)
Admission: EM | Admit: 2011-09-23 | Discharge: 2011-09-23 | Disposition: A | Discharge status: Home / Self Care | Payer: Medicaid Other | Attending: Emergency Medicine | Admitting: Emergency Medicine

## 2011-09-23 DIAGNOSIS — R109 Unspecified abdominal pain: Secondary | ICD-10-CM

## 2011-09-23 HISTORY — DX: Calculus of kidney: N20.0

## 2011-09-23 NOTE — ED Notes (Signed)
Attempted to call x3 to exam room, no response removed from system

## 2011-10-02 LAB — RAPID STREP SCREEN (MED CTR MEBANE ONLY): Streptococcus, Group A Screen (Direct): POSITIVE — AB

## 2012-10-13 ENCOUNTER — Emergency Department (HOSPITAL_COMMUNITY)
Admission: EM | Admit: 2012-10-13 | Discharge: 2012-10-14 | Disposition: A | Discharge status: Home / Self Care | Payer: Medicaid Other | Attending: Emergency Medicine | Admitting: Emergency Medicine

## 2012-10-13 ENCOUNTER — Encounter (HOSPITAL_COMMUNITY): Payer: Self-pay | Admitting: Emergency Medicine

## 2012-10-13 DIAGNOSIS — K089 Disorder of teeth and supporting structures, unspecified: Secondary | ICD-10-CM | POA: Insufficient documentation

## 2012-10-13 DIAGNOSIS — K0889 Other specified disorders of teeth and supporting structures: Secondary | ICD-10-CM

## 2012-10-13 DIAGNOSIS — Z87442 Personal history of urinary calculi: Secondary | ICD-10-CM | POA: Insufficient documentation

## 2012-10-13 MED ORDER — OXYCODONE-ACETAMINOPHEN 5-325 MG PO TABS: 2.0000 | ORAL_TABLET | ORAL | Status: DC | PRN

## 2012-10-13 MED ORDER — OXYCODONE-ACETAMINOPHEN 5-325 MG PO TABS
2.0000 | ORAL_TABLET | Freq: Once | ORAL | Status: AC
  Administered 2012-10-13: 2 via ORAL
  Filled 2012-10-13: qty 2

## 2012-10-13 MED ORDER — PENICILLIN V POTASSIUM 500 MG PO TABS: 500.0000 mg | ORAL_TABLET | Freq: Four times a day (QID) | ORAL | Status: AC

## 2012-10-13 MED ORDER — KETOROLAC TROMETHAMINE 60 MG/2ML IM SOLN
60.0000 mg | Freq: Once | INTRAMUSCULAR | Status: AC
  Administered 2012-10-13: 60 mg via INTRAMUSCULAR
  Filled 2012-10-13: qty 2

## 2012-10-13 NOTE — ED Notes (Signed)
Pt states that she had a filling placed six months ago on the right upper side of her mouth.  States that she began experiencing dental pain in that area around 17:00 this evening and she took 800 mg of Ibuprofen but that it is not helping.

## 2012-10-13 NOTE — ED Provider Notes (Signed)
History   This chart was scribed for Glynn Octave, MD by Gerlean Ren. This patient was seen in room [facility] and the patient's care was started at 22:59.   CSN: [id number]  Arrival date & time 10/13/12  2254   None     Chief Complaint  Patient presents with  . Dental Pain    (Consider location/radiation/quality/duration/timing/severity/associated sxs/prior treatment) The history is provided by the patient. No language interpreter was used.  Robin Garrett is a 32 y.o. female who presents to the Emergency Department complaining of dental pain on right upper side of mouth with sudden onset while eating hard food 6 hours ago that has not improved with 800mg  ibuprofen.  Pt reports affected tooth had filling put in 6 months ago.  Pt denies fever, neck pain, sore throat, visual disturbance, CP, cough, SOB, abdominal pain, nausea, emesis, diarrhea, urinary symptoms, back pain, HA, weakness, numbness and rash as associated symptoms.  Pt has no h/o chronic medical conditions.  Pt denies tobacco and alcohol use.       Past Medical History  Diagnosis Date  . Kidney stone     Past Surgical History  Procedure Date  . Lithotripsy   . Tubal ligation     No family history on file.  History  Substance Use Topics  . Smoking status: Never Smoker   . Smokeless tobacco: Not on file  . Alcohol Use: No    No OB history provided.  Review of Systems A complete 10 system review of systems was obtained and all systems are negative except as noted in the HPI and PMH.   Allergies  Morphine and related  Home Medications   Current Outpatient Rx  Name Route Sig Dispense Refill  . IBUPROFEN 200 MG PO TABS Oral Take 600 mg by mouth as needed. For pain       BP 141/88  Pulse 125  Temp 98.4 F (36.9 C) (Oral)  Resp 16  Ht 4\' 11"  (1.499 m)  Wt 175 lb (79.379 kg)  BMI 35.35 kg/m2  SpO2 99%  LMP 10/10/2012  Physical Exam  Nursing note and vitals reviewed. Constitutional: She is  oriented to person, place, and time. She appears well-developed and well-nourished.  HENT:  Head: Normocephalic and atraumatic.  Mouth/Throat: Oropharynx is clear and moist.       Right upper 1st molar has exposed root that is tender to palpation.  No abscess.  No trismus.  Floor of mouth soft.  Eyes: EOM are normal.  Neck: Normal range of motion. No tracheal deviation present.  Cardiovascular: Normal rate.   Pulmonary/Chest: Effort normal. No respiratory distress.  Musculoskeletal: Normal range of motion.  Neurological: She is alert and oriented to person, place, and time.  Skin: Skin is warm.  Psychiatric: She has a normal mood and affect.    ED Course  Procedures (including critical care time) DIAGNOSTIC STUDIES: Oxygen Saturation is 99% on room air, normal by my interpretation.    COORDINATION OF CARE: 23:56- Patient informed that she cannot receive narcotic analgesics unless she has someone else to transport her home.  Ordered IM toradol.      Labs Reviewed - No data to display No results found.   No diagnosis found.    MDM  Right upper molar dental pain from biting something hard. No difficulty breathing or swallowing. Floor of mouth soft, no evidence of Ludwig's angina.  Narcotic pain medication not given until patient's ride arrived.  F/u dentistry.  I personally  performed the services described in this documentation, which was scribed in my presence.  The recorded information has been reviewed and considered.        Glynn Octave, MD 10/14/12 [phone]

## 2013-06-29 ENCOUNTER — Encounter (HOSPITAL_COMMUNITY): Payer: Self-pay

## 2013-06-29 DIAGNOSIS — Z87442 Personal history of urinary calculi: Secondary | ICD-10-CM | POA: Insufficient documentation

## 2013-06-29 DIAGNOSIS — R112 Nausea with vomiting, unspecified: Secondary | ICD-10-CM | POA: Insufficient documentation

## 2013-06-29 DIAGNOSIS — Z8719 Personal history of other diseases of the digestive system: Secondary | ICD-10-CM | POA: Insufficient documentation

## 2013-06-29 DIAGNOSIS — R1011 Right upper quadrant pain: Secondary | ICD-10-CM | POA: Insufficient documentation

## 2013-06-29 DIAGNOSIS — K8070 Calculus of gallbladder and bile duct without cholecystitis without obstruction: Secondary | ICD-10-CM | POA: Insufficient documentation

## 2013-06-29 NOTE — ED Notes (Signed)
Pt with chronic gallbladder issues, states she is scheduled to have it removed on Tues in [facility] [place].   Pt states she ate greasy food and is having a flare up.

## 2013-06-30 ENCOUNTER — Emergency Department (HOSPITAL_COMMUNITY)
Admission: EM | Admit: 2013-06-30 | Discharge: 2013-06-30 | Disposition: A | Discharge status: Home / Self Care | Payer: Medicaid Other | Attending: Emergency Medicine | Admitting: Emergency Medicine

## 2013-06-30 DIAGNOSIS — R109 Unspecified abdominal pain: Secondary | ICD-10-CM

## 2013-06-30 DIAGNOSIS — K8020 Calculus of gallbladder without cholecystitis without obstruction: Secondary | ICD-10-CM

## 2013-06-30 HISTORY — DX: Cholecystitis, unspecified: K81.9

## 2013-06-30 LAB — CBC WITH DIFFERENTIAL/PLATELET
Basophils Relative: 0 % (ref 0–1)
Eosinophils Absolute: 0.2 10*3/uL (ref 0.0–0.7)
Eosinophils Relative: 2 % (ref 0–5)
HCT: 35.4 % — ABNORMAL LOW (ref 36.0–46.0)
Hemoglobin: 12.4 g/dL (ref 12.0–15.0)
MCH: 30.2 pg (ref 26.0–34.0)
MCHC: 35 g/dL (ref 30.0–36.0)
MCV: 86.3 fL (ref 78.0–100.0)
Monocytes Absolute: 0.5 10*3/uL (ref 0.1–1.0)
Monocytes Relative: 6 % (ref 3–12)
Neutrophils Relative %: 52 % (ref 43–77)

## 2013-06-30 LAB — HEPATIC FUNCTION PANEL
Alkaline Phosphatase: 81 U/L (ref 39–117)
Total Bilirubin: 0.1 mg/dL — ABNORMAL LOW (ref 0.3–1.2)

## 2013-06-30 MED ORDER — PANTOPRAZOLE SODIUM 40 MG PO TBEC
40.0000 mg | DELAYED_RELEASE_TABLET | Freq: Every day | ORAL | Status: DC
  Administered 2013-06-30: 40 mg via ORAL
  Filled 2013-06-30: qty 1

## 2013-06-30 MED ORDER — PANTOPRAZOLE SODIUM 20 MG PO TBEC: 20.0000 mg | DELAYED_RELEASE_TABLET | Freq: Every day | ORAL | Status: DC

## 2013-06-30 MED ORDER — OXYCODONE-ACETAMINOPHEN 5-325 MG PO TABS: 1.0000 | ORAL_TABLET | Freq: Four times a day (QID) | ORAL | Status: DC | PRN

## 2013-06-30 MED ORDER — FAMOTIDINE 20 MG PO TABS
20.0000 mg | ORAL_TABLET | Freq: Once | ORAL | Status: AC
  Administered 2013-06-30: 20 mg via ORAL
  Filled 2013-06-30: qty 1

## 2013-06-30 MED ORDER — OXYCODONE-ACETAMINOPHEN 5-325 MG PO TABS: 1.0000 | ORAL_TABLET | ORAL | Status: DC | PRN

## 2013-06-30 NOTE — ED Provider Notes (Signed)
History    CSN: [id number] Arrival date & time 06/29/13  2314  First MD Initiated Contact with Patient 06/30/13 0020     Chief Complaint  Patient presents with  . Abdominal Pain   (Consider location/radiation/quality/duration/timing/severity/associated sxs/prior Treatment) HPI Comments: Patient is a 32 year old female who has a history of cholelithiasis. She is scheduled to have surgery on Tuesday in [place]. She is here visiting her grandmother because of family member is in the hospital. The patient states that this morning she had aches, sausage, and bacon for breakfast. This afternoon around 4:00 she had cheeseburgers. Approximately 445 she began to have pain, bloating, nausea, and one episode of vomiting. The pain is mostly in the right upper quadrant. The patient denies fever chills. Complains of pain that radiates into the back. Patient tried ibuprofen but this was not effective. She became frightened because the pain would not ease and came to the emergency department for evaluation.  Patient is a 32 y.o. female presenting with abdominal pain. The history is provided by the patient.  Abdominal Pain Associated symptoms include abdominal pain, nausea and vomiting. Pertinent negatives include no arthralgias, chest pain, coughing or neck pain.   Past Medical History  Diagnosis Date  . Kidney stone   . Cholecystitis    Past Surgical History  Procedure Laterality Date  . Lithotripsy    . Tubal ligation     No family history on file. History  Substance Use Topics  . Smoking status: Never Smoker   . Smokeless tobacco: Not on file  . Alcohol Use: No   OB History   Grav Para Term Preterm Abortions TAB SAB Ect Mult Living                 Review of Systems  Constitutional: Negative for activity change.       All ROS Neg except as noted in HPI  HENT: Negative for nosebleeds and neck pain.   Eyes: Negative for photophobia and discharge.  Respiratory: Negative for cough,  shortness of breath and wheezing.   Cardiovascular: Negative for chest pain and palpitations.  Gastrointestinal: Positive for nausea, vomiting and abdominal pain. Negative for blood in stool.  Genitourinary: Positive for flank pain. Negative for dysuria, frequency and hematuria.  Musculoskeletal: Negative for back pain and arthralgias.  Skin: Negative.   Neurological: Negative for dizziness, seizures and speech difficulty.  Psychiatric/Behavioral: Negative for hallucinations and confusion.    Allergies  Morphine and related  Home Medications   Current Outpatient Rx  Name  Route  Sig  Dispense  Refill  . ibuprofen (ADVIL,MOTRIN) 200 MG tablet   Oral   Take 600 mg by mouth as needed. For pain          . oxyCODONE-acetaminophen (PERCOCET/ROXICET) 5-325 MG per tablet   Oral   Take 2 tablets by mouth every 4 (four) hours as needed for pain.   15 tablet   0    BP 128/77  Pulse 95  Temp(Src) 98.7 F (37.1 C) (Oral)  Resp 18  Ht 4\' 11"  (1.499 m)  Wt 177 lb (80.287 kg)  BMI 35.73 kg/m2  SpO2 100%  LMP 06/19/2013 Physical Exam  Nursing note and vitals reviewed. Constitutional: She is oriented to person, place, and time. She appears well-developed and well-nourished.  Non-toxic appearance.  HENT:  Head: Normocephalic.  Right Ear: Tympanic membrane and external ear normal.  Left Ear: Tympanic membrane and external ear normal.  Eyes: EOM and lids are normal. Pupils  are equal, round, and reactive to light. No scleral icterus.  Neck: Normal range of motion. Neck supple. Carotid bruit is not present.  Cardiovascular: Normal rate, regular rhythm, normal heart sounds, intact distal pulses and normal pulses.   Pulmonary/Chest: Breath sounds normal. No respiratory distress.  Abdominal: Soft. Bowel sounds are normal. There is no tenderness. There is no guarding.  Right upper quadrant tenderness to palpation. Abdomen is soft with good bowel sounds. No mass appreciated. No pulsatile mass  appreciated.  Musculoskeletal: Normal range of motion.  Lymphadenopathy:       Head (right side): No submandibular adenopathy present.       Head (left side): No submandibular adenopathy present.    She has no cervical adenopathy.  Neurological: She is alert and oriented to person, place, and time. She has normal strength. No cranial nerve deficit or sensory deficit.  Skin: Skin is warm and dry.  Psychiatric: She has a normal mood and affect. Her speech is normal.    ED Course  Procedures (including critical care time) Labs Reviewed  CBC WITH DIFFERENTIAL - Abnormal; Notable for the following:    HCT 35.4 (*)    All other components within normal limits  LIPASE, BLOOD  HEPATIC FUNCTION PANEL   No results found. No diagnosis found.  MDM  *I have reviewed nursing notes, vital signs, and all appropriate lab and imaging results for this patient.** Patient has a history of gallstones, is scheduled for surgery for removal on Tuesday in [place]. Patient states that she had eggs bacon sausage for breakfast, and cheeseburgers at around 4:00. Proximally 445 and 5 PM the patient states that she was sick with bloating, pain, nausea, and one episode of vomiting.   Complete blood count is well within normal limits, lipase wnl at 45. Hepatic function wnl. No temp elevation.  Pt treaated for pain and nausea. Rx for percocet, and protonix given to the patient.    Kathie Dike, PA-C 07/01/13 1154

## 2013-07-01 NOTE — ED Provider Notes (Signed)
Medical screening examination/treatment/procedure(s) were performed by non-physician practitioner and as supervising physician I was immediately available for consultation/collaboration.  Jones Skene, M.D.     Jones Skene, MD 07/01/13 2304

## 2013-07-06 ENCOUNTER — Encounter (HOSPITAL_COMMUNITY): Payer: Self-pay

## 2013-07-06 DIAGNOSIS — Z79899 Other long term (current) drug therapy: Secondary | ICD-10-CM | POA: Insufficient documentation

## 2013-07-06 DIAGNOSIS — R1110 Vomiting, unspecified: Secondary | ICD-10-CM | POA: Insufficient documentation

## 2013-07-06 DIAGNOSIS — Z8719 Personal history of other diseases of the digestive system: Secondary | ICD-10-CM | POA: Insufficient documentation

## 2013-07-06 DIAGNOSIS — R1011 Right upper quadrant pain: Secondary | ICD-10-CM | POA: Insufficient documentation

## 2013-07-06 DIAGNOSIS — Z3202 Encounter for pregnancy test, result negative: Secondary | ICD-10-CM | POA: Insufficient documentation

## 2013-07-06 NOTE — ED Notes (Signed)
Patient states that she was supposed to have her gallbladder removed on 2023-05-14, the MD had a death in his family and they had to cancel it. They have not rescheduled the surgery. Patient complaining of pain in her right side and right upper abdomen per pt. Patient states that she has vomited 3 times.

## 2013-07-07 ENCOUNTER — Emergency Department (HOSPITAL_COMMUNITY): Payer: Medicaid Other

## 2013-07-07 ENCOUNTER — Emergency Department (HOSPITAL_COMMUNITY)
Admission: EM | Admit: 2013-07-07 | Discharge: 2013-07-07 | Disposition: A | Discharge status: Home / Self Care | Payer: Medicaid Other | Attending: Emergency Medicine | Admitting: Emergency Medicine

## 2013-07-07 DIAGNOSIS — R109 Unspecified abdominal pain: Secondary | ICD-10-CM

## 2013-07-07 DIAGNOSIS — Z8719 Personal history of other diseases of the digestive system: Secondary | ICD-10-CM

## 2013-07-07 LAB — CBC
MCHC: 34.5 g/dL (ref 30.0–36.0)
Platelets: 326 10*3/uL (ref 150–400)
RDW: 13.7 % (ref 11.5–15.5)
WBC: 10.5 10*3/uL (ref 4.0–10.5)

## 2013-07-07 LAB — COMPREHENSIVE METABOLIC PANEL
AST: 14 U/L (ref 0–37)
Albumin: 4.6 g/dL (ref 3.5–5.2)
Alkaline Phosphatase: 69 U/L (ref 39–117)
Chloride: 103 mEq/L (ref 96–112)
Creatinine, Ser: 1.09 mg/dL (ref 0.50–1.10)
Potassium: 3.1 mEq/L — ABNORMAL LOW (ref 3.5–5.1)
Total Bilirubin: 0.1 mg/dL — ABNORMAL LOW (ref 0.3–1.2)
Total Protein: 8.5 g/dL — ABNORMAL HIGH (ref 6.0–8.3)

## 2013-07-07 LAB — URINE MICROSCOPIC-ADD ON

## 2013-07-07 LAB — URINALYSIS, ROUTINE W REFLEX MICROSCOPIC
Glucose, UA: NEGATIVE mg/dL
Ketones, ur: NEGATIVE mg/dL
Leukocytes, UA: NEGATIVE
Nitrite: NEGATIVE
Specific Gravity, Urine: 1.015 (ref 1.005–1.030)
pH: 6 (ref 5.0–8.0)

## 2013-07-07 MED ORDER — OXYCODONE-ACETAMINOPHEN 5-325 MG PO TABS: 1.0000 | ORAL_TABLET | Freq: Four times a day (QID) | ORAL | Status: DC | PRN

## 2013-07-07 MED ORDER — OXYCODONE-ACETAMINOPHEN 5-325 MG PO TABS
2.0000 | ORAL_TABLET | Freq: Once | ORAL | Status: AC
  Administered 2013-07-07: 2 via ORAL
  Filled 2013-07-07: qty 2

## 2013-07-07 MED ORDER — PANTOPRAZOLE SODIUM 40 MG IV SOLR
40.0000 mg | Freq: Once | INTRAVENOUS | Status: AC
  Administered 2013-07-07: 40 mg via INTRAVENOUS
  Filled 2013-07-07: qty 40

## 2013-07-07 MED ORDER — SODIUM CHLORIDE 0.9 % IV SOLN
INTRAVENOUS | Status: DC
  Administered 2013-07-07: 01:00:00 via INTRAVENOUS

## 2013-07-07 MED ORDER — KETOROLAC TROMETHAMINE 30 MG/ML IJ SOLN
30.0000 mg | Freq: Once | INTRAMUSCULAR | Status: AC
  Administered 2013-07-07: 30 mg via INTRAVENOUS
  Filled 2013-07-07: qty 1

## 2013-07-07 MED ORDER — ONDANSETRON HCL 4 MG PO TABS: 4.0000 mg | ORAL_TABLET | Freq: Four times a day (QID) | ORAL | Status: DC

## 2013-07-07 MED ORDER — ONDANSETRON HCL 4 MG/2ML IJ SOLN
4.0000 mg | Freq: Once | INTRAMUSCULAR | Status: AC
  Administered 2013-07-07: 4 mg via INTRAVENOUS
  Filled 2013-07-07: qty 2

## 2013-07-07 NOTE — ED Provider Notes (Signed)
History    CSN: [id number] Arrival date & time 07/06/13  2301  First MD Initiated Contact with Patient 07/07/13 0033     Chief Complaint  Patient presents with  . Flank Pain   (Consider location/radiation/quality/duration/timing/severity/associated sxs/prior Treatment) HPI Hx per PT - HAs gallstones and was scheduled for lap chole in [place].  That surgery was delayed by DR Phineas Inches at [facility].  Was supposed to have surgery 3 days ago.  Today, had bacon for breakfast and a hamburger at lunch. Tonight she developed recurrent RUQ ABD pain with N/V x 3 episodes. No hematemesis.  No F/C. No diarrhea. IS here visiting family tonight. Pain is sharp and radiates R flank. No CP or SOB.   Past Medical History  Diagnosis Date  . Kidney stone   . Cholecystitis    Past Surgical History  Procedure Laterality Date  . Lithotripsy    . Tubal ligation     No family history on file. History  Substance Use Topics  . Smoking status: Never Smoker   . Smokeless tobacco: Not on file  . Alcohol Use: No   OB History   Grav Para Term Preterm Abortions TAB SAB Ect Mult Living                 Review of Systems  Constitutional: Negative for fever and chills.  HENT: Negative for neck pain and neck stiffness.   Eyes: Negative for pain.  Respiratory: Negative for shortness of breath.   Cardiovascular: Negative for chest pain.  Gastrointestinal: Positive for vomiting and abdominal pain.  Genitourinary: Negative for dysuria.  Musculoskeletal: Negative for back pain.  Skin: Negative for rash.  Neurological: Negative for headaches.  All other systems reviewed and are negative.    Allergies  Morphine and related  Home Medications   Current Outpatient Rx  Name  Route  Sig  Dispense  Refill  . ibuprofen (ADVIL,MOTRIN) 200 MG tablet   Oral   Take 600 mg by mouth as needed. For pain          . pantoprazole (PROTONIX) 20 MG tablet   Oral   Take 1 tablet (20 mg total) by mouth  daily.   20 tablet   0   . oxyCODONE-acetaminophen (PERCOCET/ROXICET) 5-325 MG per tablet   Oral   Take 2 tablets by mouth every 4 (four) hours as needed for pain.   15 tablet   0   . oxyCODONE-acetaminophen (PERCOCET/ROXICET) 5-325 MG per tablet   Oral   Take 1 tablet by mouth every 4 (four) hours as needed for pain.   6 tablet   0   . oxyCODONE-acetaminophen (PERCOCET/ROXICET) 5-325 MG per tablet   Oral   Take 1 tablet by mouth every 6 (six) hours as needed for pain.   15 tablet   0    BP 120/79  Pulse 106  Temp(Src) 98.9 F (37.2 C) (Oral)  Resp 20  Ht 5' (1.524 m)  Wt 176 lb (79.833 kg)  BMI 34.37 kg/m2  SpO2 99%  LMP 06/19/2013 Physical Exam  Nursing note and vitals reviewed. Constitutional: She is oriented to person, place, and time. She appears well-developed and well-nourished.  HENT:  Head: Normocephalic and atraumatic.  Mouth/Throat: Oropharynx is clear and moist. No oropharyngeal exudate.  Eyes: Conjunctivae and EOM are normal. Pupils are equal, round, and reactive to light. No scleral icterus.  Neck: Neck supple.  Cardiovascular: Normal heart sounds and intact distal pulses.   Tachy HR  100-110  Pulmonary/Chest: Effort normal. No respiratory distress.  Abdominal: Soft. Bowel sounds are normal. She exhibits no distension. There is no rebound and no guarding.  RUQ TTP, negative Murphys  Musculoskeletal: Normal range of motion. She exhibits no edema.  Neurological: She is alert and oriented to person, place, and time.  Skin: Skin is warm and dry.    ED Course  Procedures (including critical care time)  Results for orders placed during the hospital encounter of 07/07/13  CBC      Result Value Range   WBC 10.5  4.0 - 10.5 K/uL   RBC 4.21  3.87 - 5.11 MIL/uL   Hemoglobin 12.6  12.0 - 15.0 g/dL   HCT 40.9  81.1 - 91.4 %   MCV 86.7  78.0 - 100.0 fL   MCH 29.9  26.0 - 34.0 pg   MCHC 34.5  30.0 - 36.0 g/dL   RDW 78.2  95.6 - 21.3 %   Platelets 326   150 - 400 K/uL  COMPREHENSIVE METABOLIC PANEL      Result Value Range   Sodium 137  135 - 145 mEq/L   Potassium 3.1 (*) 3.5 - 5.1 mEq/L   Chloride 103  96 - 112 mEq/L   CO2 22  19 - 32 mEq/L   Glucose, Bld 91  70 - 99 mg/dL   BUN 18  6 - 23 mg/dL   Creatinine, Ser 0.86  0.50 - 1.10 mg/dL   Calcium 57.8  8.4 - 46.9 mg/dL   Total Protein 8.5 (*) 6.0 - 8.3 g/dL   Albumin 4.6  3.5 - 5.2 g/dL   AST 14  0 - 37 U/L   ALT 13  0 - 35 U/L   Alkaline Phosphatase 69  39 - 117 U/L   Total Bilirubin 0.1 (*) 0.3 - 1.2 mg/dL   GFR calc non Af Amer 67 (*) >90 mL/min   GFR calc Af Amer 78 (*) >90 mL/min  LIPASE, BLOOD      Result Value Range   Lipase 45  11 - 59 U/L  URINALYSIS, ROUTINE W REFLEX MICROSCOPIC      Result Value Range   Color, Urine YELLOW  YELLOW   APPearance CLEAR  CLEAR   Specific Gravity, Urine 1.015  1.005 - 1.030   pH 6.0  5.0 - 8.0   Glucose, UA NEGATIVE  NEGATIVE mg/dL   Hgb urine dipstick LARGE (*) NEGATIVE   Bilirubin Urine NEGATIVE  NEGATIVE   Ketones, ur NEGATIVE  NEGATIVE mg/dL   Protein, ur NEGATIVE  NEGATIVE mg/dL   Urobilinogen, UA 0.2  0.0 - 1.0 mg/dL   Nitrite NEGATIVE  NEGATIVE   Leukocytes, UA NEGATIVE  NEGATIVE  PREGNANCY, URINE      Result Value Range   Preg Test, Ur NEGATIVE  NEGATIVE  URINE MICROSCOPIC-ADD ON      Result Value Range   Squamous Epithelial / LPF FEW (*) RARE   WBC, UA 0-2  <3 WBC/hpf   RBC / HPF TOO NUMEROUS TO COUNT  <3 RBC/hpf   Bacteria, UA FEW (*) RARE   Ct Abdomen Pelvis Wo Contrast  07/07/2013   *RADIOLOGY REPORT*  Clinical Data: Right flank pain.  CT ABDOMEN AND PELVIS WITHOUT CONTRAST  Technique:  Multidetector CT imaging of the abdomen and pelvis was performed following the standard protocol without intravenous contrast.  Comparison: CT of the abdomen and pelvis performed 03/05/2010  Findings: Minimal bibasilar atelectasis is noted.  The liver and spleen  are unremarkable in appearance.  The gallbladder is within normal limits.   The pancreas and adrenal glands are unremarkable.  There is left renal atrophy, with scattered associated small left renal cysts, not well characterized.  Mild right-sided renal scarring is noted.  Scattered bilateral renal stones are seen, measuring up to 3 mm in size.  No obstructing ureteral stones are identified.  There is no evidence of hydronephrosis.  No free fluid is identified.  The small bowel is unremarkable in appearance.  The stomach is within normal limits.  No acute vascular abnormalities are seen.  The appendix is normal in caliber and contains air, without evidence for appendicitis.  The colon is unremarkable in appearance.  The bladder is mildly distended and grossly unremarkable in appearance.  The uterus is within normal limits.  The ovaries are relatively symmetric; no suspicious adnexal masses are seen.  No inguinal lymphadenopathy is seen.  No acute osseous abnormalities are identified.  IMPRESSION:  1.  No evidence of hydronephrosis; no obstructing ureteral stones seen. 2.  Scattered small nonobstructing bilateral renal stones seen, measuring up to 3 mm in size. 3.  Left renal atrophy, with small left renal cysts; mild right- sided renal scarring seen.   Original Report Authenticated By: Tonia Ghent, M.D.    Patient drove herself to the emergency department and is requesting pain medications that would be okay for her to drive home with. IV Toradol and Protonix provided. Labs obtained and reviewed - normal LFTs and no leukocytosis. Urinalysis reviewed and does have significant hematuria. CT scan was ordered and reviewed no ureterolithiasis. Patient requesting prescription until she can see her surgeon in [place] to reschedule gallbladder removal.  Pain improved in the ED. No emesis in the ED. No fever. No acute abdomen. Patient is agreeable to return precautions, dietary restrictions, and close surgical followup.   MDM  Right upper quadrant abdominal pain known cholelithiasis  Labs  and urinalysis obtained and reviewed as above  CT scan reviewed as above  Medications provided  Vital signs and nurses notes reviewed and considered - prior ER visits reviewed  Sunnie Nielsen, MD 07/07/13 [phone]

## 2013-07-07 NOTE — ED Notes (Signed)
Pt up to bathroom, pt states she has to leave

## 2013-07-07 NOTE — ED Notes (Signed)
Pt alert & oriented x4, stable gait. Patient given discharge instructions, paperwork & prescription(s). Patient  instructed to stop at the registration desk to finish any additional paperwork. Patient verbalized understanding. Pt left department w/ no further questions. 

## 2013-07-10 MED FILL — Oxycodone w/ Acetaminophen Tab 5-325 MG: ORAL | Qty: 6 | Status: AC

## 2013-07-18 ENCOUNTER — Emergency Department (HOSPITAL_COMMUNITY): Payer: Medicaid Other

## 2013-07-18 ENCOUNTER — Emergency Department (HOSPITAL_COMMUNITY)
Admission: EM | Admit: 2013-07-18 | Discharge: 2013-07-19 | Disposition: A | Discharge status: Home / Self Care | Payer: Medicaid Other | Attending: Emergency Medicine | Admitting: Emergency Medicine

## 2013-07-18 DIAGNOSIS — Z9851 Tubal ligation status: Secondary | ICD-10-CM | POA: Insufficient documentation

## 2013-07-18 DIAGNOSIS — Z8742 Personal history of other diseases of the female genital tract: Secondary | ICD-10-CM | POA: Insufficient documentation

## 2013-07-18 DIAGNOSIS — Z79899 Other long term (current) drug therapy: Secondary | ICD-10-CM | POA: Insufficient documentation

## 2013-07-18 DIAGNOSIS — Z9889 Other specified postprocedural states: Secondary | ICD-10-CM | POA: Insufficient documentation

## 2013-07-18 DIAGNOSIS — R109 Unspecified abdominal pain: Secondary | ICD-10-CM | POA: Insufficient documentation

## 2013-07-18 LAB — COMPREHENSIVE METABOLIC PANEL
ALT: 19 U/L (ref 0–35)
AST: 19 U/L (ref 0–37)
Albumin: 4.3 g/dL (ref 3.5–5.2)
Alkaline Phosphatase: 75 U/L (ref 39–117)
Calcium: 9.6 mg/dL (ref 8.4–10.5)
GFR calc Af Amer: >90 mL/min (ref >90)
Glucose, Bld: 123 mg/dL — ABNORMAL HIGH (ref 70–99)
Potassium: 3.4 mEq/L — ABNORMAL LOW (ref 3.5–5.1)
Sodium: 134 mEq/L — ABNORMAL LOW (ref 135–145)
Total Protein: 8.3 g/dL (ref 6.0–8.3)

## 2013-07-18 LAB — CBC WITH DIFFERENTIAL/PLATELET
Basophils Absolute: 0 10*3/uL (ref 0.0–0.1)
Basophils Relative: 0 % (ref 0–1)
Eosinophils Absolute: 0.2 10*3/uL (ref 0.0–0.7)
Eosinophils Relative: 2 % (ref 0–5)
Lymphs Abs: 4.1 10*3/uL — ABNORMAL HIGH (ref 0.7–4.0)
MCH: 30.1 pg (ref 26.0–34.0)
MCHC: 35 g/dL (ref 30.0–36.0)
MCV: 86.1 fL (ref 78.0–100.0)
Neutrophils Relative %: 57 % (ref 43–77)
Platelets: 343 10*3/uL (ref 150–400)
RBC: 4.25 MIL/uL (ref 3.87–5.11)
RDW: 13.6 % (ref 11.5–15.5)

## 2013-07-18 MED ORDER — KETOROLAC TROMETHAMINE 30 MG/ML IJ SOLN
30.0000 mg | Freq: Once | INTRAMUSCULAR | Status: AC
  Administered 2013-07-18: 30 mg via INTRAVENOUS
  Filled 2013-07-18: qty 1

## 2013-07-18 MED ORDER — ONDANSETRON HCL 4 MG/2ML IJ SOLN
4.0000 mg | Freq: Once | INTRAMUSCULAR | Status: AC
  Administered 2013-07-18: 4 mg via INTRAVENOUS
  Filled 2013-07-18: qty 2

## 2013-07-18 MED ORDER — FENTANYL CITRATE 0.05 MG/ML IJ SOLN
50.0000 ug | Freq: Once | INTRAMUSCULAR | Status: AC
  Administered 2013-07-18: 100 ug via INTRAVENOUS

## 2013-07-18 MED ORDER — FENTANYL CITRATE 0.05 MG/ML IJ SOLN
INTRAMUSCULAR | Status: AC
  Administered 2013-07-18: 100 ug via INTRAVENOUS
  Filled 2013-07-18: qty 2

## 2013-07-18 MED ORDER — HYDROMORPHONE HCL PF 1 MG/ML IJ SOLN
1.0000 mg | Freq: Once | INTRAMUSCULAR | Status: AC
  Administered 2013-07-18: 1 mg via INTRAVENOUS
  Filled 2013-07-18: qty 1

## 2013-07-18 NOTE — ED Notes (Signed)
Pt states she took zofran and motrin at home.

## 2013-07-18 NOTE — ED Notes (Signed)
Reports hx kidney stones; was seen and tx in this ED 2 weeks ago for same; reports returning pain tonight approx 1700.

## 2013-07-19 NOTE — ED Notes (Signed)
Patient given discharge instruction, verbalized understand. IV removed, band aid applied. Patient ambulatory out of the department with daughter  

## 2013-07-19 NOTE — ED Provider Notes (Signed)
CSN: [id number]     Arrival date & time 07/18/13  2233 History     First MD Initiated Contact with Patient 07/18/13 2304     Chief Complaint  Patient presents with  . Flank Pain   (Consider location/radiation/quality/duration/timing/severity/associated sxs/prior Treatment) HPI HPI Comments: Robin Garrett is a 32 y.o. female with a h/o kidney stones who presents to the Emergency Department complaining of right sided blank pain that began at 1700 tonight. She states it feels like kidney stones. She was seen in the ER two weeks ago and had a CT which showed a 3 mm stone and scattered smaller stones. She denies fever, chills, nausea, vomiting.    Past Medical History  Diagnosis Date  . Kidney stone   . Cholecystitis    Past Surgical History  Procedure Laterality Date  . Lithotripsy    . Tubal ligation     No family history on file. History  Substance Use Topics  . Smoking status: Never Smoker   . Smokeless tobacco: Not on file  . Alcohol Use: No   OB History   Grav Para Term Preterm Abortions TAB SAB Ect Mult Living                 Review of Systems  Constitutional: Negative for fever.       10 Systems reviewed and are negative for acute change except as noted in the HPI.  HENT: Negative for congestion.   Eyes: Negative for discharge and redness.  Respiratory: Negative for cough and shortness of breath.   Cardiovascular: Negative for chest pain.  Gastrointestinal: Negative for vomiting and abdominal pain.  Genitourinary: Positive for flank pain.  Musculoskeletal: Negative for back pain.  Skin: Negative for rash.  Neurological: Negative for syncope, numbness and headaches.  Psychiatric/Behavioral:       No behavior change.    Allergies  Morphine and related  Home Medications   Current Outpatient Rx  Name  Route  Sig  Dispense  Refill  . ibuprofen (ADVIL,MOTRIN) 200 MG tablet   Oral   Take 600 mg by mouth as needed. For pain          . ondansetron (ZOFRAN)  4 MG tablet   Oral   Take 1 tablet (4 mg total) by mouth every 6 (six) hours.   12 tablet   0   . pantoprazole (PROTONIX) 20 MG tablet   Oral   Take 1 tablet (20 mg total) by mouth daily.   20 tablet   0   . oxyCODONE-acetaminophen (PERCOCET/ROXICET) 5-325 MG per tablet   Oral   Take 1 tablet by mouth every 6 (six) hours as needed for pain.   12 tablet   0    BP 124/91  Pulse 100  Temp(Src) 98.4 F (36.9 C) (Oral)  Resp 18  Ht 4\' 11"  (1.499 m)  Wt 176 lb (79.833 kg)  BMI 35.53 kg/m2  SpO2 98%  LMP 07/18/2013 Physical Exam  Nursing note and vitals reviewed. Constitutional: She appears well-developed and well-nourished.  Awake, alert, nontoxic appearance.  HENT:  Head: Normocephalic and atraumatic.  Eyes: EOM are normal. Pupils are equal, round, and reactive to light.  Neck: Neck supple.  Cardiovascular: Normal rate and intact distal pulses.   Pulmonary/Chest: Effort normal and breath sounds normal. She exhibits no tenderness.  Abdominal: Soft. Bowel sounds are normal. There is no tenderness. There is no rebound.  Genitourinary:  No cva tenderness  Musculoskeletal: She exhibits no tenderness.  Baseline ROM, no obvious new focal weakness.  Neurological:  Mental status and motor strength appears baseline for patient and situation.  Skin: No rash noted.  Psychiatric: She has a normal mood and affect.    ED Course   Procedures (including critical care time) Results for orders placed during the hospital encounter of 07/18/13  CBC WITH DIFFERENTIAL      Result Value Range   WBC 10.7 (*) 4.0 - 10.5 K/uL   RBC 4.25  3.87 - 5.11 MIL/uL   Hemoglobin 12.8  12.0 - 15.0 g/dL   HCT 91.4  78.2 - 95.6 %   MCV 86.1  78.0 - 100.0 fL   MCH 30.1  26.0 - 34.0 pg   MCHC 35.0  30.0 - 36.0 g/dL   RDW 21.3  08.6 - 57.8 %   Platelets 343  150 - 400 K/uL   Neutrophils Relative % 57  43 - 77 %   Neutro Abs 6.0  1.7 - 7.7 K/uL   Lymphocytes Relative 38  12 - 46 %   Lymphs Abs 4.1  (*) 0.7 - 4.0 K/uL   Monocytes Relative 3  3 - 12 %   Monocytes Absolute 0.4  0.1 - 1.0 K/uL   Eosinophils Relative 2  0 - 5 %   Eosinophils Absolute 0.2  0.0 - 0.7 K/uL   Basophils Relative 0  0 - 1 %   Basophils Absolute 0.0  0.0 - 0.1 K/uL  COMPREHENSIVE METABOLIC PANEL      Result Value Range   Sodium 134 (*) 135 - 145 mEq/L   Potassium 3.4 (*) 3.5 - 5.1 mEq/L   Chloride 101  96 - 112 mEq/L   CO2 19  19 - 32 mEq/L   Glucose, Bld 123 (*) 70 - 99 mg/dL   BUN 21  6 - 23 mg/dL   Creatinine, Ser 4.69  0.50 - 1.10 mg/dL   Calcium 9.6  8.4 - 62.9 mg/dL   Total Protein 8.3  6.0 - 8.3 g/dL   Albumin 4.3  3.5 - 5.2 g/dL   AST 19  0 - 37 U/L   ALT 19  0 - 35 U/L   Alkaline Phosphatase 75  39 - 117 U/L   Total Bilirubin 0.1 (*) 0.3 - 1.2 mg/dL   GFR calc non Af Amer 81 (*) >90 mL/min   GFR calc Af Amer >90  >90 mL/min  LIPASE, BLOOD      Result Value Range   Lipase 63 (*) 11 - 59 U/L    Ct Abdomen Pelvis Wo Contrast  07/19/2013   *RADIOLOGY REPORT*  Clinical Data: Right flank pain.  History of kidney stones and cholecystitis.  CT ABDOMEN AND PELVIS WITHOUT CONTRAST  Technique:  Multidetector CT imaging of the abdomen and pelvis was performed following the standard protocol without intravenous contrast.  Comparison: 07/07/2013.  Findings: Lung Bases: Unchanged nodular atelectasis at the left lung base.  Liver:  Unenhanced CT was performed per clinician order.  Lack of IV contrast limits sensitivity and specificity, especially for evaluation of abdominal/pelvic solid viscera.  Grossly normal.  Spleen:  Normal.  Gallbladder:  Normal.  Common bile duct:  Normal.  Pancreas:  Normal.  Adrenal glands:  Normal bilaterally.  Kidneys:  Left renal atrophy with cortical scarring. Nonobstructing punctate collecting system calculi.  The left ureter appears normal. Right ureter shows compensatory hypertrophy and nonobstructing collecting system calculi.  The right ureter also appears normal.  Stomach:   Normal.  Small bowel:  Normal.  Colon:   Normal appendix extending towards the midline.  Moderate stool burden.  Pelvic Genitourinary:  Anteverted uterus.  Urinary bladder appears normal.  No free fluid.  Bones:  No aggressive osseous lesions.  Vasculature: Grossly normal.  Body Wall: Tiny fat containing periumbilical hernia.  IMPRESSION: 1.  No acute abnormality. 2.  Nonobstructing bilateral renal collecting system calculi.  No ureteral calculi. 3.  Left renal atrophy and scarring with compensatory hypertrophy of the right kidney.   Original Report Authenticated By: Andreas Newport, M.D.   No diagnosis found.  MDM  Patient with right flank pain. H/o kidney stones. CT negative for stones. Reviewed results with the patient. Pt stable in ED with no significant deterioration in condition.The patient appears reasonably screened and/or stabilized for discharge and I doubt any other medical condition or other [facility] requiring further screening, evaluation, or treatment in the ED at this time prior to discharge.  MDM Reviewed: nursing note and vitals Interpretation: labs and CT scan     Nicoletta Dress. Colon Branch, MD 07/19/13 [phone]

## 2014-04-02 ENCOUNTER — Encounter (HOSPITAL_COMMUNITY): Payer: Self-pay | Admitting: Emergency Medicine

## 2014-04-02 ENCOUNTER — Emergency Department (HOSPITAL_COMMUNITY)
Admission: EM | Admit: 2014-04-02 | Discharge: 2014-04-03 | Disposition: A | Discharge status: Home / Self Care | Payer: Medicaid Other | Attending: Emergency Medicine | Admitting: Emergency Medicine

## 2014-04-02 DIAGNOSIS — Z8719 Personal history of other diseases of the digestive system: Secondary | ICD-10-CM | POA: Insufficient documentation

## 2014-04-02 DIAGNOSIS — N200 Calculus of kidney: Secondary | ICD-10-CM | POA: Insufficient documentation

## 2014-04-02 DIAGNOSIS — Z9851 Tubal ligation status: Secondary | ICD-10-CM | POA: Insufficient documentation

## 2014-04-02 LAB — URINALYSIS, ROUTINE W REFLEX MICROSCOPIC
BILIRUBIN URINE: NEGATIVE
GLUCOSE, UA: NEGATIVE mg/dL
KETONES UR: NEGATIVE mg/dL
Nitrite: NEGATIVE
PH: 6 (ref 5.0–8.0)
Protein, ur: NEGATIVE mg/dL
Specific Gravity, Urine: 1.02 (ref 1.005–1.030)
Urobilinogen, UA: 0.2 mg/dL (ref 0.0–1.0)

## 2014-04-02 LAB — URINE MICROSCOPIC-ADD ON

## 2014-04-02 MED ORDER — HYDROMORPHONE HCL PF 1 MG/ML IJ SOLN
1.0000 mg | Freq: Once | INTRAMUSCULAR | Status: AC
  Administered 2014-04-02: 1 mg via INTRAMUSCULAR
  Filled 2014-04-02: qty 1

## 2014-04-02 MED ORDER — KETOROLAC TROMETHAMINE 60 MG/2ML IM SOLN
60.0000 mg | Freq: Once | INTRAMUSCULAR | Status: AC
  Administered 2014-04-02: 60 mg via INTRAMUSCULAR
  Filled 2014-04-02: qty 2

## 2014-04-02 MED ORDER — ONDANSETRON 4 MG PO TBDP
4.0000 mg | ORAL_TABLET | Freq: Once | ORAL | Status: AC
  Administered 2014-04-02: 4 mg via ORAL
  Filled 2014-04-02: qty 1

## 2014-04-02 MED ORDER — DIPHENHYDRAMINE HCL 50 MG/ML IJ SOLN
25.0000 mg | Freq: Once | INTRAMUSCULAR | Status: AC
  Administered 2014-04-02: 25 mg via INTRAMUSCULAR
  Filled 2014-04-02: qty 1

## 2014-04-02 NOTE — ED Provider Notes (Signed)
CSN: [id number]     Arrival date & time 04/02/14  2223 History  This chart was scribed for Robin RollerBrian D Janijah Symons, MD by Robin Garrett, ED Scribe. This patient was seen in room APA12/APA12 and the patient's care was started at 11:13 PM.     Chief Complaint  Patient presents with  . Nephrolithiasis      The history is provided by the patient. No language interpreter was used.    HPI Comments: Robin Garrett is a 33 y.o. female who presents to the Emergency Department complaining of right flank pain that radiates to the RLQ and into the right groin that has been ongoing for a while but became worse tonight. She describes the pain as sharp and stabbing.  A prior Ct scan in July 2014 showed bilateral non-obstructing stones. Pt claims that she has had recent CT scan that showed one stone was moving on the right side. She reports that [place] to follow up for the symptoms. She reports having a h/o kidney stones since the age of 33 with prior episodes that passed on their own, needing lithotripsy and some requiring surgery. She states that she has taken Tramadol that she has at home without improvement. She is also currently on Flomax and phenergan with improvement in her urination and nausea. She denies any fevers or emesis as associated symptoms.   Past Medical History  Diagnosis Date  . Kidney stone   . Cholecystitis    Past Surgical History  Procedure Laterality Date  . Lithotripsy    . Tubal ligation     History reviewed. No pertinent family history. History  Substance Use Topics  . Smoking status: Never Smoker   . Smokeless tobacco: Not on file  . Alcohol Use: No   No OB history provided.  Review of Systems  A complete 10 system review of systems was obtained and all systems are negative except as noted in the HPI and PMH.    Allergies  Morphine and related  Home Medications   Current Outpatient Rx  Name  Route  Sig   Dispense  Refill  . traMADol (ULTRAM) 50 MG tablet   Oral   Take 50 mg by mouth once as needed for moderate pain or severe pain.         . naproxen (NAPROSYN) 500 MG tablet   Oral   Take 1 tablet (500 mg total) by mouth 2 (two) times daily with a meal.   30 tablet   0   . oxyCODONE-acetaminophen (PERCOCET) 5-325 MG per tablet   Oral   Take 1 tablet by mouth every 4 (four) hours as needed.   20 tablet   0   . promethazine (PHENERGAN) 25 MG tablet   Oral   Take 1 tablet (25 mg total) by mouth every 6 (six) hours as needed for nausea or vomiting.   12 tablet   0   . tamsulosin (FLOMAX) 0.4 MG CAPS capsule   Oral   Take 1 capsule (0.4 mg total) by mouth 2 (two) times daily.   10 capsule   0    Triage Vitals: BP 106/82  Pulse 103  Temp(Src) 98 F (36.7 C) (Oral)  Resp 20  Ht 4\' 11"  (1.499 m)  Wt 173 lb (78.472 kg)  BMI 34.92 kg/m2  SpO2 100%  LMP 03/21/2014  Physical Exam  Nursing note and vitals reviewed. Constitutional: She is oriented to person,  place, and time. She appears well-developed and well-nourished. No distress.  Uncomfortable appearing  HENT:  Head: Normocephalic and atraumatic.  Eyes: EOM are normal.  Neck: Normal range of motion.  Cardiovascular: Normal rate, regular rhythm and normal heart sounds.   Pulmonary/Chest: Effort normal and breath sounds normal.  Abdominal: Soft. She exhibits no distension. There is tenderness (miminal RLQ, no other abdominal tenderness ).  Right CVA tenderness   Musculoskeletal: Normal range of motion.  Neurological: She is alert and oriented to person, place, and time.  Skin: Skin is warm and dry.  Psychiatric: She has a normal mood and affect. Judgment normal.    ED Course  Procedures (including critical care time)  Medications  ketorolac (TORADOL) injection 60 mg (60 mg Intramuscular Given 04/02/14 2325)  HYDROmorphone (DILAUDID) injection 1 mg (1 mg Intramuscular Given 04/02/14 2325)  diphenhydrAMINE  (BENADRYL) injection 25 mg (25 mg Intramuscular Given 04/02/14 2325)  ondansetron (ZOFRAN-ODT) disintegrating tablet 4 mg (4 mg Oral Given 04/02/14 2325)    DIAGNOSTIC STUDIES: Oxygen Saturation is 100% on RA, normal by my interpretation.    COORDINATION OF CARE: 11:16 PM-Discussed treatment plan which includes Toradol for pain management and rule out UTI with pt at bedside and pt agreed to plan. No further work up needed due to pt's impending Urology appointment this week.  Labs Review Labs Reviewed  URINALYSIS, ROUTINE W REFLEX MICROSCOPIC - Abnormal; Notable for the following:    Hgb urine dipstick MODERATE (*)    Leukocytes, UA SMALL (*)    All other components within normal limits  URINE MICROSCOPIC-ADD ON - Abnormal; Notable for the following:    Squamous Epithelial / LPF MANY (*)    Bacteria, UA MANY (*)    Crystals CA OXALATE CRYSTALS (*)    All other components within normal limits   Imaging Review No results found.   MDM   Final diagnoses:  Kidney stones    Improved significantly after meds, UA with s/s of stone, VS normal, pain nearly gone, pt requesting d/c, meds for home to get her through to her f/u with Urology on Friday.  Pt in agreement.  Meds given in ED:  Medications  ketorolac (TORADOL) injection 60 mg (60 mg Intramuscular Given 04/02/14 2325)  HYDROmorphone (DILAUDID) injection 1 mg (1 mg Intramuscular Given 04/02/14 2325)  diphenhydrAMINE (BENADRYL) injection 25 mg (25 mg Intramuscular Given 04/02/14 2325)  ondansetron (ZOFRAN-ODT) disintegrating tablet 4 mg (4 mg Oral Given 04/02/14 2325)    New Prescriptions   NAPROXEN (NAPROSYN) 500 MG TABLET    Take 1 tablet (500 mg total) by mouth 2 (two) times daily with a meal.   OXYCODONE-ACETAMINOPHEN (PERCOCET) 5-325 MG PER TABLET    Take 1 tablet by mouth every 4 (four) hours as needed.   PROMETHAZINE (PHENERGAN) 25 MG TABLET    Take 1 tablet (25 mg total) by mouth every 6 (six) hours as needed for nausea or  vomiting.   TAMSULOSIN (FLOMAX) 0.4 MG CAPS CAPSULE    Take 1 capsule (0.4 mg total) by mouth 2 (two) times daily.    I personally performed the services described in this documentation, which was scribed in my presence. The recorded information has been reviewed and is accurate.        Robin RollerBrian D Gaven Eugene, MD 04/03/14 [phone]

## 2014-04-02 NOTE — ED Notes (Signed)
Having kidney stones removed from both kidneys on Friday per pt. Having severe pain tonight.

## 2014-04-03 MED ORDER — NAPROXEN 500 MG PO TABS: 500.0000 mg | ORAL_TABLET | Freq: Two times a day (BID) | ORAL | Status: DC

## 2014-04-03 MED ORDER — PROMETHAZINE HCL 25 MG PO TABS: 25.0000 mg | ORAL_TABLET | Freq: Four times a day (QID) | ORAL | Status: DC | PRN

## 2014-04-03 MED ORDER — OXYCODONE-ACETAMINOPHEN 5-325 MG PO TABS: 1.0000 | ORAL_TABLET | ORAL | Status: DC | PRN

## 2014-04-03 MED ORDER — TAMSULOSIN HCL 0.4 MG PO CAPS: 0.4000 mg | ORAL_CAPSULE | Freq: Two times a day (BID) | ORAL | Status: DC

## 2014-04-03 NOTE — Discharge Instructions (Signed)
Your exam and or your xrays have shown that you likely have a kidney stone.  You should follow up with the Urologist of your choosing or the Urologist listed above in the next 2-3 days if you have not passed the stone.  You should urinate in to the strainer until you pass the stone.    Flomax helps with passing the stone by opening up the Ureters (tubes), Vicodin and an antiinflammatory for pain if you are not allergic to these medicines.  Phenergan or Zofran for nausea.  Return to the ER for severe or worsening pain, vomiting or fevers or if you are unable to control your pain with the medicines provided.  Kidney Stones Kidney stones (ureteral lithiasis) are deposits that form inside your kidneys. The intense pain is caused by the stone moving through the urinary tract. When the stone moves, the ureter goes into spasm around the stone. The stone is usually passed in the urine.  CAUSES  A disorder that makes certain neck glands produce too much parathyroid hormone (primary hyperparathyroidism).  A buildup of uric acid crystals.  Narrowing (stricture) of the ureter.  A kidney obstruction present at birth (congenital obstruction).  Previous surgery on the kidney or ureters.  Numerous kidney infections.  SYMPTOMS  Feeling sick to your stomach (nauseous).  Throwing up (vomiting).  Blood in the urine (hematuria).  Pain that usually spreads (radiates) to the groin.  Frequency or urgency of urination.  DIAGNOSIS  Taking a history and physical exam.  Blood or urine tests.  Computerized X-ray scan (CT scan).  Occasionally, an examination of the inside of the urinary bladder (cystoscopy) is performed.  TREATMENT  Observation.  Increasing your fluid intake.  Surgery may be needed if you have severe pain or persistent obstruction.  The size, location, and chemical composition are all important variables that will determine the proper choice of action for you. Talk to your caregiver to better  understand your situation so that you will minimize the risk of injury to yourself and your kidney.  HOME CARE INSTRUCTIONS  Drink enough water and fluids to keep your urine clear or pale yellow.  Strain all urine through the provided strainer. Keep all particulate matter and stones for your caregiver to see. The stone causing the pain may be as small as a grain of salt. It is very important to use the strainer each and every time you pass your urine. The collection of your stone will allow your caregiver to analyze it and verify that a stone has actually passed.  Only take over-the-counter or prescription medicines for pain, discomfort, or fever as directed by your caregiver.  Make a follow-up appointment with your caregiver as directed.  Get follow-up X-rays if required. The absence of pain does not always mean that the stone has passed. It may have only stopped moving. If the urine remains completely obstructed, it can cause loss of kidney function or even complete destruction of the kidney. It is your responsibility to make sure X-rays and follow-ups are completed. Ultrasounds of the kidney can show blockages and the status of the kidney. Ultrasounds are not associated with any radiation and can be performed easily in a matter of minutes.  SEEK IMMEDIATE MEDICAL CARE IF:  Pain cannot be controlled with the prescribed medicine.  You have a fever.  The severity or intensity of pain increases over 18 hours and is not relieved by pain medicine.  You develop a new onset of abdominal pain.  You   feel faint or pass out.  MAKE SURE YOU:  Understand these instructions.  Will watch your condition.  Will get help right away if you are not doing well or get worse.  Document Released: 12/07/2005 Document Revised: 11/26/2011 Document Reviewed: 04/04/2010 Exit[employer] Patient Information 2012 [employer].    

## 2014-04-09 ENCOUNTER — Emergency Department (HOSPITAL_COMMUNITY)
Admission: EM | Admit: 2014-04-09 | Discharge: 2014-04-10 | Discharge status: Against Medical Advice | Payer: Medicaid Other | Attending: Emergency Medicine | Admitting: Emergency Medicine

## 2014-04-09 ENCOUNTER — Encounter (HOSPITAL_COMMUNITY): Payer: Self-pay | Admitting: Emergency Medicine

## 2014-04-09 DIAGNOSIS — R109 Unspecified abdominal pain: Secondary | ICD-10-CM | POA: Insufficient documentation

## 2014-04-09 NOTE — ED Notes (Signed)
Pain rt flank, hx of kidney stones.

## 2014-04-10 NOTE — ED Notes (Signed)
Called for room placement.  No response. 

## 2014-04-10 NOTE — ED Notes (Signed)
Pt called once for room placement.  No response

## 2014-05-17 ENCOUNTER — Encounter (HOSPITAL_COMMUNITY): Payer: Self-pay | Admitting: Emergency Medicine

## 2014-05-17 ENCOUNTER — Emergency Department (HOSPITAL_COMMUNITY)
Admission: EM | Admit: 2014-05-17 | Discharge: 2014-05-17 | Disposition: A | Discharge status: Home / Self Care | Payer: Medicaid Other | Attending: Emergency Medicine | Admitting: Emergency Medicine

## 2014-05-17 DIAGNOSIS — R000 Tachycardia, unspecified: Secondary | ICD-10-CM | POA: Insufficient documentation

## 2014-05-17 DIAGNOSIS — Z87442 Personal history of urinary calculi: Secondary | ICD-10-CM | POA: Insufficient documentation

## 2014-05-17 DIAGNOSIS — Z79899 Other long term (current) drug therapy: Secondary | ICD-10-CM | POA: Insufficient documentation

## 2014-05-17 DIAGNOSIS — Z791 Long term (current) use of non-steroidal anti-inflammatories (NSAID): Secondary | ICD-10-CM | POA: Insufficient documentation

## 2014-05-17 DIAGNOSIS — K089 Disorder of teeth and supporting structures, unspecified: Secondary | ICD-10-CM | POA: Insufficient documentation

## 2014-05-17 DIAGNOSIS — K0889 Other specified disorders of teeth and supporting structures: Secondary | ICD-10-CM

## 2014-05-17 DIAGNOSIS — K029 Dental caries, unspecified: Secondary | ICD-10-CM | POA: Insufficient documentation

## 2014-05-17 DIAGNOSIS — R109 Unspecified abdominal pain: Secondary | ICD-10-CM | POA: Insufficient documentation

## 2014-05-17 MED ORDER — HYDROCODONE-ACETAMINOPHEN 5-325 MG PO TABS: 1.0000 | ORAL_TABLET | ORAL | Status: DC | PRN

## 2014-05-17 MED ORDER — MELOXICAM 15 MG PO TABS: 15.0000 mg | ORAL_TABLET | Freq: Every day | ORAL | Status: DC

## 2014-05-17 MED ORDER — ACETAMINOPHEN 500 MG PO TABS
1000.0000 mg | ORAL_TABLET | Freq: Once | ORAL | Status: AC
  Administered 2014-05-17: 500 mg via ORAL
  Filled 2014-05-17: qty 2

## 2014-05-17 MED ORDER — KETOROLAC TROMETHAMINE 10 MG PO TABS
10.0000 mg | ORAL_TABLET | Freq: Once | ORAL | Status: AC
  Administered 2014-05-17: 10 mg via ORAL
  Filled 2014-05-17: qty 1

## 2014-05-17 NOTE — Discharge Instructions (Signed)
Toothache  Toothaches are usually caused by tooth decay (cavity). However, other causes of toothache include:  · Gum disease.  · Cracked tooth.  · Cracked filling.  · Injury.  · Jaw problem (temporo mandibular joint or TMJ disorder).  · Tooth abscess.  · Root sensitivity.  · Grinding.  · Eruption problems.  Swelling and redness around a painful tooth often means you have a dental abscess.  Pain medicine and antibiotics can help reduce symptoms, but you will need to see a dentist within the next few days to have your problem properly evaluated and treated. If tooth decay is the problem, you may need a filling or root canal to save your tooth. If the problem is more severe, your tooth may need to be pulled.  SEEK IMMEDIATE MEDICAL CARE IF:  · You cannot swallow.  · You develop severe swelling, increased redness, or increased pain in your mouth or face.  · You have a fever.  · You cannot open your mouth adequately.  Document Released: 01/14/2005 Document Revised: 02/29/2012 Document Reviewed: 03/06/2010  Exit[employer]® Patient Information ©2014 [employer].

## 2014-05-17 NOTE — ED Provider Notes (Signed)
CSN: [id number]     Arrival date & time 05/17/14  2204 History   First MD Initiated Contact with Patient 05/17/14 2314     Chief Complaint  Patient presents with  . Dental Pain     (Consider location/radiation/quality/duration/timing/severity/associated sxs/prior Treatment) Patient is a 33 y.o. female presenting with tooth pain. The history is provided by the patient.  Dental Pain Location:  Lower Severity:  Severe Onset quality:  Gradual Timing:  Intermittent Progression:  Worsening Chronicity:  Chronic Context: dental caries and poor dentition   Relieved by:  Nothing Ineffective treatments:  Topical anesthetic gel Associated symptoms: facial pain and gum swelling   Associated symptoms: no neck pain   Risk factors: no smoking     Past Medical History  Diagnosis Date  . Kidney stone   . Cholecystitis    Past Surgical History  Procedure Laterality Date  . Lithotripsy    . Tubal ligation     No family history on file. History  Substance Use Topics  . Smoking status: Never Smoker   . Smokeless tobacco: Not on file  . Alcohol Use: No   OB History   Grav Para Term Preterm Abortions TAB SAB Ect Mult Living                 Review of Systems  Constitutional: Negative for activity change.       All ROS Neg except as noted in HPI  HENT: Positive for dental problem.   Eyes: Negative for photophobia and discharge.  Respiratory: Negative for cough, shortness of breath and wheezing.   Cardiovascular: Negative for chest pain and palpitations.  Gastrointestinal: Negative for abdominal pain and blood in stool.  Genitourinary: Positive for flank pain. Negative for dysuria, frequency and hematuria.  Musculoskeletal: Negative for arthralgias, back pain and neck pain.  Skin: Negative.   Neurological: Negative for dizziness, seizures and speech difficulty.  Psychiatric/Behavioral: Negative for hallucinations and confusion.      Allergies  Morphine and related  Home  Medications   Prior to Admission medications   Medication Sig Start Date End Date Taking? Authorizing Provider  naproxen (NAPROSYN) 500 MG tablet Take 1 tablet (500 mg total) by mouth 2 (two) times daily with a meal. 04/03/14   Vida Roller, MD  oxyCODONE-acetaminophen (PERCOCET) 5-325 MG per tablet Take 1 tablet by mouth every 4 (four) hours as needed. 04/03/14   Vida Roller, MD  promethazine (PHENERGAN) 25 MG tablet Take 1 tablet (25 mg total) by mouth every 6 (six) hours as needed for nausea or vomiting. 04/03/14   Vida Roller, MD  tamsulosin (FLOMAX) 0.4 MG CAPS capsule Take 1 capsule (0.4 mg total) by mouth 2 (two) times daily. 04/03/14   Vida Roller, MD  traMADol (ULTRAM) 50 MG tablet Take 50 mg by mouth once as needed for moderate pain or severe pain.    Historical Provider, MD   BP 120/82  Pulse 118  Temp(Src) 98.8 F (37.1 C) (Oral)  Resp 22  Ht 4\' 11"  (1.499 m)  Wt 177 lb 5 oz (80.428 kg)  BMI 35.79 kg/m2  SpO2 97%  LMP 05/10/2014 Physical Exam  Nursing note and vitals reviewed. Constitutional: She is oriented to person, place, and time. She appears well-developed and well-nourished.  Non-toxic appearance.  HENT:  Head: Normocephalic.  Right Ear: Tympanic membrane and external ear normal.  Left Ear: Tympanic membrane and external ear normal.  Para dental caries noted throughout the upper and lower gums.  There is swelling about the right lower gum. There is no visible abscess appreciated. There is no bleeding appreciated. There is no swelling under the tongue. The airway is patent, the patient speaks in complete sentences.  Eyes: EOM and lids are normal. Pupils are equal, round, and reactive to light.  Neck: Normal range of motion. Neck supple. Carotid bruit is not present.  Cardiovascular: Regular rhythm, normal heart sounds, intact distal pulses and normal pulses.  Tachycardia present.   Pulmonary/Chest: Breath sounds normal. No respiratory distress.  Abdominal:  Soft. Bowel sounds are normal. There is no tenderness. There is no guarding.  Musculoskeletal: Normal range of motion.  Lymphadenopathy:       Head (right side): No submandibular adenopathy present.       Head (left side): No submandibular adenopathy present.    She has no cervical adenopathy.  Neurological: She is alert and oriented to person, place, and time. She has normal strength. No cranial nerve deficit or sensory deficit.  Skin: Skin is warm and dry.  Psychiatric: She has a normal mood and affect. Her speech is normal.    ED Course  Procedures (including critical care time) Labs Review Labs Reviewed - No data to display  Imaging Review No results found.   EKG Interpretation None      MDM No visible abscess appreciated on tonight examination. The airway is patent. No evidence for Ludwig's angina. Patient is treated with patient is treated with Norco and Mobic as the patient is already being treated with amoxicillin.    Final diagnoses:  None    **I have reviewed nursing notes, vital signs, and all appropriate lab and imaging results for this patient.Kathie Dike*    Minnetta Sandora M Chayse Zatarain, PA-C 05/19/14 1718

## 2014-05-17 NOTE — ED Notes (Signed)
Having a tooth ache in the right bottom side per pt.

## 2014-05-21 NOTE — ED Provider Notes (Signed)
Medical screening examination/treatment/procedure(s) were performed by non-physician practitioner and as supervising physician I was immediately available for consultation/collaboration.    Vida Roller, MD 05/21/14 [phone]

## 2014-05-29 MED FILL — Hydrocodone-Acetaminophen Tab 5-325 MG: ORAL | Qty: 6 | Status: AC

## 2014-06-11 ENCOUNTER — Emergency Department (HOSPITAL_COMMUNITY)
Admission: EM | Admit: 2014-06-11 | Discharge: 2014-06-12 | Discharge status: Against Medical Advice | Payer: Medicaid Other | Attending: Emergency Medicine | Admitting: Emergency Medicine

## 2014-06-11 ENCOUNTER — Encounter (HOSPITAL_COMMUNITY): Payer: Self-pay | Admitting: Emergency Medicine

## 2014-06-11 DIAGNOSIS — Z792 Long term (current) use of antibiotics: Secondary | ICD-10-CM | POA: Insufficient documentation

## 2014-06-11 DIAGNOSIS — M5380 Other specified dorsopathies, site unspecified: Secondary | ICD-10-CM | POA: Insufficient documentation

## 2014-06-11 DIAGNOSIS — Z87442 Personal history of urinary calculi: Secondary | ICD-10-CM | POA: Insufficient documentation

## 2014-06-11 DIAGNOSIS — Z8719 Personal history of other diseases of the digestive system: Secondary | ICD-10-CM | POA: Insufficient documentation

## 2014-06-11 DIAGNOSIS — Z79899 Other long term (current) drug therapy: Secondary | ICD-10-CM | POA: Insufficient documentation

## 2014-06-11 DIAGNOSIS — M62830 Muscle spasm of back: Secondary | ICD-10-CM

## 2014-06-11 DIAGNOSIS — Z9851 Tubal ligation status: Secondary | ICD-10-CM | POA: Insufficient documentation

## 2014-06-11 DIAGNOSIS — Z3202 Encounter for pregnancy test, result negative: Secondary | ICD-10-CM | POA: Insufficient documentation

## 2014-06-11 DIAGNOSIS — N390 Urinary tract infection, site not specified: Secondary | ICD-10-CM

## 2014-06-11 LAB — COMPREHENSIVE METABOLIC PANEL
ALK PHOS: 73 U/L (ref 39–117)
ALT: 18 U/L (ref 0–35)
AST: 17 U/L (ref 0–37)
Albumin: 4 g/dL (ref 3.5–5.2)
BUN: 17 mg/dL (ref 6–23)
CO2: 22 meq/L (ref 19–32)
Calcium: 9.6 mg/dL (ref 8.4–10.5)
Chloride: 101 mEq/L (ref 96–112)
Creatinine, Ser: 1.07 mg/dL (ref 0.50–1.10)
GFR calc non Af Amer: 68 mL/min — ABNORMAL LOW (ref >90)
GFR, EST AFRICAN AMERICAN: 79 mL/min — AB (ref >90)
GLUCOSE: 92 mg/dL (ref 70–99)
POTASSIUM: 3.5 meq/L — AB (ref 3.7–5.3)
SODIUM: 138 meq/L (ref 137–147)
TOTAL PROTEIN: 7.9 g/dL (ref 6.0–8.3)
Total Bilirubin: <0.2 mg/dL — ABNORMAL LOW (ref 0.3–1.2)

## 2014-06-11 LAB — CBC WITH DIFFERENTIAL/PLATELET
Basophils Absolute: 0 10*3/uL (ref 0.0–0.1)
Basophils Relative: 0 % (ref 0–1)
Eosinophils Absolute: 0.2 10*3/uL (ref 0.0–0.7)
Eosinophils Relative: 2 % (ref 0–5)
HCT: 35.6 % — ABNORMAL LOW (ref 36.0–46.0)
Hemoglobin: 12.5 g/dL (ref 12.0–15.0)
LYMPHS ABS: 4.3 10*3/uL — AB (ref 0.7–4.0)
LYMPHS PCT: 35 % (ref 12–46)
MCH: 30.3 pg (ref 26.0–34.0)
MCHC: 35.1 g/dL (ref 30.0–36.0)
MCV: 86.4 fL (ref 78.0–100.0)
Monocytes Absolute: 0.6 10*3/uL (ref 0.1–1.0)
Monocytes Relative: 5 % (ref 3–12)
NEUTROS PCT: 58 % (ref 43–77)
Neutro Abs: 7.2 10*3/uL (ref 1.7–7.7)
PLATELETS: 362 10*3/uL (ref 150–400)
RBC: 4.12 MIL/uL (ref 3.87–5.11)
RDW: 13.8 % (ref 11.5–15.5)
WBC: 12.4 10*3/uL — AB (ref 4.0–10.5)

## 2014-06-11 MED ORDER — METHOCARBAMOL 500 MG PO TABS
500.0000 mg | ORAL_TABLET | Freq: Once | ORAL | Status: AC
  Administered 2014-06-11: 500 mg via ORAL
  Filled 2014-06-11: qty 1

## 2014-06-11 MED ORDER — SODIUM CHLORIDE 0.9 % IV BOLUS (SEPSIS)
1000.0000 mL | Freq: Once | INTRAVENOUS | Status: AC
  Administered 2014-06-12: 1000 mL via INTRAVENOUS

## 2014-06-11 MED ORDER — KETOROLAC TROMETHAMINE 30 MG/ML IJ SOLN
30.0000 mg | Freq: Once | INTRAMUSCULAR | Status: AC
  Administered 2014-06-11: 30 mg via INTRAVENOUS
  Filled 2014-06-11: qty 1

## 2014-06-11 MED ORDER — ONDANSETRON HCL 4 MG/2ML IJ SOLN
4.0000 mg | Freq: Once | INTRAMUSCULAR | Status: AC
  Administered 2014-06-11: 4 mg via INTRAVENOUS
  Filled 2014-06-11: qty 2

## 2014-06-11 NOTE — ED Notes (Signed)
Patient states that she is to have kidney stones removed Monday. Patient states that she started having pain to her left flank area today that she has not had

## 2014-06-11 NOTE — ED Provider Notes (Signed)
CSN: [id number]     Arrival date & time 06/11/14  2117 History   First MD Initiated Contact with Patient 06/11/14 2257    This chart was scribed for Robin Raceravid Yelverton, MD by Marica OtterNusrat Rahman, ED Scribe. This patient was seen in room APA10/APA10 and the patient's care was started at 11:17 PM.  PCP: No PCP Per Patient  Chief Complaint  Patient presents with  . Flank Pain    The history is provided by the patient. No language interpreter was used.   HPI Comments Robin Garrett is a 33 y.o. female, with a history of kidney stones and cholecystitis, who presents to the Emergency Department complaining of constant left flank pain with associated nausea but no vomiting since 4PM this evening. Pt reports taking tramadol at home without much relief. Pt denies fever, chills. Pt states she believes the pain is due to her kidney stones. She states that she is scheduled to have her kidney stones removed on 06/18/14. She denies dysuria, hematuria, vaginal bleeding or discharge.   Past Medical History  Diagnosis Date  . Kidney stone   . Cholecystitis    Past Surgical History  Procedure Laterality Date  . Lithotripsy    . Tubal ligation     History reviewed. No pertinent family history. History  Substance Use Topics  . Smoking status: Never Smoker   . Smokeless tobacco: Not on file  . Alcohol Use: No   OB History   Grav Para Term Preterm Abortions TAB SAB Ect Mult Living                 Review of Systems  Constitutional: Negative for fever and chills.  Respiratory: Negative for cough and shortness of breath.   Cardiovascular: Negative for chest pain.  Gastrointestinal: Positive for nausea. Negative for vomiting, abdominal pain, diarrhea and constipation.  Genitourinary: Negative for dysuria, frequency, hematuria, flank pain, vaginal bleeding, difficulty urinating and pelvic pain.  Musculoskeletal: Positive for back pain and myalgias. Negative for neck pain and neck stiffness.  Skin: Negative for  rash and wound.  Neurological: Negative for dizziness, weakness, light-headedness, numbness and headaches.  All other systems reviewed and are negative.     Allergies  Morphine and related  Home Medications   Prior to Admission medications   Medication Sig Start Date End Date Taking? Authorizing Tyshika Baldridge  ciprofloxacin (CIPRO) 500 MG tablet Take 500 mg by mouth 2 (two) times daily.   Yes Historical Lateia Fraser, MD  promethazine (PHENERGAN) 25 MG tablet Take 1 tablet (25 mg total) by mouth every 6 (six) hours as needed for nausea or vomiting. 04/03/14  Yes Vida RollerBrian D Miller, MD  tamsulosin (FLOMAX) 0.4 MG CAPS capsule Take 1 capsule (0.4 mg total) by mouth 2 (two) times daily. 04/03/14  Yes Vida RollerBrian D Miller, MD  traMADol (ULTRAM) 50 MG tablet Take 50-100 mg by mouth daily as needed for moderate pain or severe pain.    Yes Historical Torunn Chancellor, MD   Triage Vitals: BP 133/75  Pulse 114  Temp(Src) 97.6 F (36.4 C) (Oral)  Resp 20  Ht 4\' 11"  (1.499 m)  Wt 168 lb (76.204 kg)  BMI 33.91 kg/m2  SpO2 97%  LMP 05/10/2014 Physical Exam  Nursing note and vitals reviewed. Constitutional: She is oriented to person, place, and time. She appears well-developed and well-nourished. No distress.  HENT:  Head: Normocephalic and atraumatic.  Mouth/Throat: Oropharynx is clear and moist.  Eyes: EOM are normal. Pupils are equal, round, and reactive to light.  Neck: Normal range of motion. Neck supple.  Cardiovascular: Normal rate and regular rhythm.   Pulmonary/Chest: Effort normal and breath sounds normal. No respiratory distress. She has no wheezes. She has no rales. She exhibits no tenderness.  Abdominal: Soft. Bowel sounds are normal. She exhibits no distension and no mass. There is no tenderness. There is no rebound and no guarding.  Musculoskeletal: Normal range of motion. She exhibits tenderness (patient has left-sided paraspinal lumbar tenderness and spasm. There is no CVA tenderness.). She exhibits no  edema.  Negative straight leg raise.  Neurological: She is alert and oriented to person, place, and time.  5/5 motor in all extremities. Sensation is intact.  Skin: Skin is warm and dry. No rash noted. No erythema.  Psychiatric: She has a normal mood and affect. Her behavior is normal.    ED Course  Procedures (including critical care time) DIAGNOSTIC STUDIES: Oxygen Saturation is 97% on RA, normal by my interpretation.    COORDINATION OF CARE: 11:20 PM-Discussed treatment plan which includes meds with pt at bedside. Patient verbalizes understanding and agrees with treatment plan.   Labs Review Labs Reviewed - No data to display  Imaging Review No results found.   EKG Interpretation None      MDM   Final diagnoses:  None    I personally performed the services described in this documentation, which was scribed in my presence. The recorded information has been reviewed and is accurate.  Patient's history and physical exam is not consistent with renal colic. She is tender to palpation with spasm of her lumbar paraspinal muscles. She has no neurologic deficit. She is very well-appearing area and we'll treat with anti-inflammatories and muscle relaxants.  Patient is requesting Dilaudid. Suspect nausea, tachycardia may be related to narcotic withdrawal. I treated with IV fluids and IV antibiotics for mild UTI. We'll continue to reassess.  Patient left AMA because she was not receiving Dilaudid. I do not have chance to speak to her before she left. Patient pulled her own IV out and left the emergency department. She did not receive the IV antibiotics. I was unable to give return precautions.  Robin Raceravid Yelverton, MD 06/12/14 [phone]

## 2014-06-12 LAB — URINALYSIS, ROUTINE W REFLEX MICROSCOPIC
BILIRUBIN URINE: NEGATIVE
Glucose, UA: NEGATIVE mg/dL
KETONES UR: NEGATIVE mg/dL
NITRITE: POSITIVE — AB
PH: 6 (ref 5.0–8.0)
Protein, ur: NEGATIVE mg/dL
Specific Gravity, Urine: 1.02 (ref 1.005–1.030)
Urobilinogen, UA: 0.2 mg/dL (ref 0.0–1.0)

## 2014-06-12 LAB — URINE MICROSCOPIC-ADD ON

## 2014-06-12 LAB — PREGNANCY, URINE: Preg Test, Ur: NEGATIVE

## 2014-06-12 MED ORDER — DEXTROSE 5 % IV SOLN
1.0000 g | Freq: Once | INTRAVENOUS | Status: AC
  Administered 2014-06-12: 1 g via INTRAVENOUS
  Filled 2014-06-12: qty 10

## 2014-06-12 NOTE — ED Notes (Signed)
Pt signed ama form

## 2014-06-12 NOTE — ED Notes (Signed)
Patient pulled out iv  And stated she was not satisfied with her care and that the doctor would not give her pain meds. Charge nures brenda at side talking to patient.

## 2016-04-21 ENCOUNTER — Encounter (HOSPITAL_COMMUNITY): Payer: Self-pay

## 2016-04-21 ENCOUNTER — Emergency Department (HOSPITAL_COMMUNITY)
Admission: EM | Admit: 2016-04-21 | Discharge: 2016-04-21 | Disposition: A | Discharge status: Home / Self Care | Payer: Medicaid Other | Attending: Emergency Medicine | Admitting: Emergency Medicine

## 2016-04-21 DIAGNOSIS — R109 Unspecified abdominal pain: Secondary | ICD-10-CM | POA: Insufficient documentation

## 2016-04-21 DIAGNOSIS — R10A0 Flank pain, unspecified side: Secondary | ICD-10-CM

## 2016-04-21 LAB — URINALYSIS, ROUTINE W REFLEX MICROSCOPIC
Bilirubin Urine: NEGATIVE
GLUCOSE, UA: NEGATIVE mg/dL
HGB URINE DIPSTICK: NEGATIVE
Ketones, ur: NEGATIVE mg/dL
Nitrite: NEGATIVE
Protein, ur: NEGATIVE mg/dL
SPECIFIC GRAVITY, URINE: 1.02 (ref 1.005–1.030)
pH: 5.5 (ref 5.0–8.0)

## 2016-04-21 LAB — BASIC METABOLIC PANEL
Anion gap: 10 (ref 5–15)
BUN: 17 mg/dL (ref 6–20)
CO2: 22 mmol/L (ref 22–32)
CREATININE: 1.03 mg/dL — AB (ref 0.44–1.00)
Calcium: 9.5 mg/dL (ref 8.9–10.3)
Chloride: 108 mmol/L (ref 101–111)
GFR calc Af Amer: >60 mL/min (ref >60)
Glucose, Bld: 94 mg/dL (ref 65–99)
Potassium: 3.9 mmol/L (ref 3.5–5.1)
SODIUM: 140 mmol/L (ref 135–145)

## 2016-04-21 LAB — CBC WITH DIFFERENTIAL/PLATELET
Basophils Absolute: 0 10*3/uL (ref 0.0–0.1)
Basophils Relative: 0 %
EOS ABS: 0.1 10*3/uL (ref 0.0–0.7)
EOS PCT: 1 %
HCT: 41.9 % (ref 36.0–46.0)
Hemoglobin: 14.5 g/dL (ref 12.0–15.0)
LYMPHS ABS: 2.7 10*3/uL (ref 0.7–4.0)
Lymphocytes Relative: 31 %
MCH: 30.9 pg (ref 26.0–34.0)
MCHC: 34.6 g/dL (ref 30.0–36.0)
MCV: 89.3 fL (ref 78.0–100.0)
MONO ABS: 0.5 10*3/uL (ref 0.1–1.0)
MONOS PCT: 5 %
Neutro Abs: 5.5 10*3/uL (ref 1.7–7.7)
Neutrophils Relative %: 63 %
PLATELETS: 317 10*3/uL (ref 150–400)
RBC: 4.69 MIL/uL (ref 3.87–5.11)
RDW: 13.1 % (ref 11.5–15.5)
WBC: 8.9 10*3/uL (ref 4.0–10.5)

## 2016-04-21 LAB — URINE MICROSCOPIC-ADD ON: RBC / HPF: NONE SEEN RBC/hpf (ref 0–5)

## 2016-04-21 LAB — PREGNANCY, URINE: PREG TEST UR: NEGATIVE

## 2016-04-21 MED ORDER — KETOROLAC TROMETHAMINE 60 MG/2ML IM SOLN
60.0000 mg | Freq: Once | INTRAMUSCULAR | Status: AC
  Administered 2016-04-21: 60 mg via INTRAMUSCULAR
  Filled 2016-04-21: qty 2

## 2016-04-21 MED ORDER — IBUPROFEN 800 MG PO TABS: 800.0000 mg | ORAL_TABLET | Freq: Three times a day (TID) | ORAL | Status: AC

## 2016-04-21 NOTE — ED Notes (Signed)
Pt states she wants to talk to the EDP. States she needs something else for pain. Motrin does not work

## 2016-04-21 NOTE — ED Notes (Signed)
Pt c/o left flank pain since yesterday.  Reports small amount of blood visible in urine.  Reports history of kidney stones.

## 2016-04-21 NOTE — ED Notes (Signed)
Patient ambulatory to restroom with steady gait.

## 2016-04-21 NOTE — ED Provider Notes (Signed)
CSN: [id number]     Arrival date & time 04/21/16  1400 History   First MD Initiated Contact with Patient 04/21/16 1414     Chief Complaint  Patient presents with  . Flank Pain     (Consider location/radiation/quality/duration/timing/severity/associated sxs/prior Treatment) HPI Comments: The pt states that last night she had acute onset of LLB pain that is constant but comes in waves - worse with movement and associated with some blood in the urine - she has nasuea but no f/c/v and no bowel habit changes or dysuria.  No m eds pta  States she has had many KS's in the past and required multiple interventions but has also passed many spontaneously.  Patient is a 35 y.o. female presenting with flank pain. The history is provided by the patient.  Flank Pain    Past Medical History  Diagnosis Date  . Kidney stone   . Cholecystitis    Past Surgical History  Procedure Laterality Date  . Lithotripsy    . Tubal ligation     No family history on file. Social History  Substance Use Topics  . Smoking status: Never Smoker   . Smokeless tobacco: None  . Alcohol Use: No   OB History    No data available     Review of Systems  Genitourinary: Positive for flank pain.  All other systems reviewed and are negative.     Allergies  Morphine and related  Home Medications   Prior to Admission medications   Medication Sig Start Date End Date Taking? Authorizing Provider  ibuprofen (ADVIL,MOTRIN) 800 MG tablet Take 1 tablet (800 mg total) by mouth 3 (three) times daily. 04/21/16   Eber HongBrian Kayce Chismar, MD   BP 120/64 mmHg  Pulse 98  Temp(Src) 97.8 F (36.6 C) (Temporal)  Resp 18  Ht 4\' 11"  (1.499 m)  Wt 154 lb (69.854 kg)  BMI 31.09 kg/m2  SpO2 100%  LMP 03/18/2016 Physical Exam  Constitutional: She appears well-developed and well-nourished. No distress.  HENT:  Head: Normocephalic and atraumatic.  Mouth/Throat: Oropharynx is clear and moist. No oropharyngeal exudate.  Eyes:  Conjunctivae and EOM are normal. Pupils are equal, round, and reactive to light. Right eye exhibits no discharge. Left eye exhibits no discharge. No scleral icterus.  Neck: Normal range of motion. Neck supple. No JVD present. No thyromegaly present.  Cardiovascular: Normal rate, regular rhythm, normal heart sounds and intact distal pulses.  Exam reveals no gallop and no friction rub.   No murmur heard. Pulmonary/Chest: Effort normal and breath sounds normal. No respiratory distress. She has no wheezes. She has no rales.  Abdominal: Soft. Bowel sounds are normal. She exhibits no distension and no mass. There is tenderness ( mild to moderate LLB and CVA ttp).  No Abd ttp  Musculoskeletal: Normal range of motion. She exhibits no edema or tenderness.  Lymphadenopathy:    She has no cervical adenopathy.  Neurological: She is alert. Coordination normal.  Skin: Skin is warm and dry. No rash noted. No erythema.  Psychiatric: She has a normal mood and affect. Her behavior is normal.  Nursing note and vitals reviewed.   ED Course  Procedures (including critical care time) Labs Review Labs Reviewed  BASIC METABOLIC PANEL - Abnormal; Notable for the following:    Creatinine, Ser 1.03 (*)    All other components within normal limits  URINALYSIS, ROUTINE W REFLEX MICROSCOPIC (NOT AT [facility]) - Abnormal; Notable for the following:    Leukocytes, UA SMALL (*)  All other components within normal limits  URINE MICROSCOPIC-ADD ON - Abnormal; Notable for the following:    Squamous Epithelial / LPF TOO NUMEROUS TO COUNT (*)    Bacteria, UA FEW (*)    All other components within normal limits  CBC WITH DIFFERENTIAL/PLATELET  PREGNANCY, URINE    Imaging Review No results found. I have personally reviewed and evaluated these images and lab results as part of my medical decision-making.    MDM   Final diagnoses:  Flank pain    EMERGENCY DEPARTMENT US RENAL EXAM  "Study: Limited Retroperitoneal  Ultrasound of Kidneys"  INDICATIONS: Flank pain  Long and short axis of both kidneys were obtained.   PERFORMED BY: Myself  IMAGES ARCHIVED?: Yes  LIMITATIONS: Body habitus  VIEWS USED: Long axis and Short axis   INTERPRETATION: No Hydronephrosis   CPT Code: [phone] (limited retroperitoneal)   No [place] evidence of hydro - no blood in the urine, normal VS - pain with movement - suggestive of MSK source.  Meds given in ED:  Medications  ketorolac (TORADOL) injection 60 mg (60 mg Intramuscular Given 04/21/16 1531)    New Prescriptions   IBUPROFEN (ADVIL,MOTRIN) 800 MG TABLET    Take 1 tablet (800 mg total) by mouth 3 (three) times daily.      Eber Hong, MD 04/21/16 [phone]

## 2016-04-21 NOTE — Discharge Instructions (Signed)

## 2023-09-21 ENCOUNTER — Emergency Department (HOSPITAL_COMMUNITY)
Admission: EM | Admit: 2023-09-21 | Discharge: 2023-09-21 | Disposition: A | Discharge status: Home / Self Care | Payer: Self-pay | Attending: Emergency Medicine | Admitting: Emergency Medicine

## 2023-09-21 ENCOUNTER — Other Ambulatory Visit: Payer: Self-pay

## 2023-09-21 ENCOUNTER — Encounter (HOSPITAL_COMMUNITY): Payer: Self-pay | Admitting: Emergency Medicine

## 2023-09-21 DIAGNOSIS — T192XXA Foreign body in vulva and vagina, initial encounter: Secondary | ICD-10-CM | POA: Insufficient documentation

## 2023-09-21 DIAGNOSIS — W448XXA Other foreign body entering into or through a natural orifice, initial encounter: Secondary | ICD-10-CM | POA: Insufficient documentation

## 2023-09-21 NOTE — ED Notes (Signed)
Pt voided small amount of cloudy urine in bedside commode. Pt did remove an empty drinking straw, approximately 3 inches from her vagina.

## 2023-09-21 NOTE — Discharge Instructions (Addendum)
You are medically cleared and are being provided referral information for assistance with substance abuse.

## 2023-09-21 NOTE — ED Notes (Signed)
Pelvic exam performed by provider. Pt tolerated well.

## 2023-09-21 NOTE — ED Provider Notes (Signed)
[place] EMERGENCY DEPARTMENT AT [facility] Provider Note   CSN: [id number] Arrival date & time: 09/21/23  1820     History  Chief Complaint  Patient presents with   Foreign Body in Vagina    Robin Garrett is a 42 y.o. female presenting for evaluation of a retained vaginal foreign body.  She was at a local store with her daughter and the daughter's boyfriend and apparently the boyfriend overdosed in the vehicle in the parking lot while the daughter and Robin Garrett was in the store.  The daughter was also significantly altered from using fentanyl, when the police were called the daughter gave Robin Garrett a straw which she had been using to snort fentanyl, because they did not want to be caught with any paraphernalia Robin Garrett inserted the straw in her vagina.  She presents for medical clearance before going to jail.  This incident occurred 4 hours ago, patient states that there might have been trace residue of fentanyl on the straw but denies any significant exposure.  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  She has no complaints physically at this time.  It was noted that when given a urine sample with a nurse tech present the straw spontaneously fell out of her vagina.  Was also noted that her urine looked very cloudy, but she states she is currently on day 3 of a 10-day course of antibiotics.  The history is provided by the patient and the police.       Home Medications Prior to Admission medications   Medication Sig Start Date End Date Taking? Authorizing Provider  ibuprofen (ADVIL,MOTRIN) 800 MG tablet Take 1 tablet (800 mg total) by mouth 3 (three) times daily. 04/21/16   Eber Hong, MD      Allergies    Morphine and codeine    Review of Systems   Review of Systems  Constitutional:  Negative for fever.  HENT:  Negative for congestion and sore throat.   Eyes: Negative.   Respiratory:  Negative for chest tightness  and shortness of breath.   Cardiovascular:  Negative for chest pain.  Gastrointestinal:  Negative for abdominal pain and nausea.  Genitourinary:  Positive for dysuria. Negative for vaginal pain.  Musculoskeletal:  Negative for arthralgias, joint swelling and neck pain.  Skin: Negative.  Negative for rash and wound.  Neurological:  Negative for dizziness, weakness, light-headedness, numbness and headaches.  Psychiatric/Behavioral: Negative.    All other systems reviewed and are negative.   Physical Exam Updated Vital Signs BP 134/89 (BP Location: Right Arm)   Pulse (!) 115   Temp 98.1 F (36.7 C) (Oral)   Resp 14   Ht 4\' 11"  (1.499 m)   Wt 56.7 kg   LMP 08/31/2023   SpO2 96%   BMI 25.25 kg/m  Physical Exam Vitals and nursing note reviewed. Exam conducted with a chaperone present.  Constitutional:      Appearance: She is well-developed. She is not toxic-appearing.  HENT:     Head: Normocephalic and atraumatic.  Eyes:     Conjunctiva/sclera: Conjunctivae normal.     Pupils: Pupils are equal, round, and reactive to light.  Cardiovascular:     Rate and Rhythm: Normal rate and regular rhythm.     Heart sounds: Normal heart sounds.  Pulmonary:     Effort: Pulmonary effort is normal.     Breath sounds: Normal breath sounds. No wheezing.  Abdominal:     General: Bowel sounds are normal.     Palpations: Abdomen is soft.     Tenderness: There is no abdominal tenderness.  Genitourinary:    Vagina: No signs of injury and foreign body.  Musculoskeletal:        General: Normal range of motion.     Cervical back: Normal range of motion.  Skin:    General: Skin is warm and dry.  Neurological:     Mental Status: She is alert.     ED Results / Procedures / Treatments   Labs (all labs ordered are listed, but only abnormal results are displayed) Labs Reviewed  URINALYSIS, ROUTINE W REFLEX MICROSCOPIC    EKG None  Radiology No results found.  Procedures Procedures     Medications Ordered in ED Medications - No data to display  ED Course/ Medical Decision Making/ A&P                                 Medical Decision Making Patient presenting with retained vaginal foreign body, specifically a straw that had been used to snort fentanyl, it does not appear that patient had any significant life-threatening exposure, this occurred 4 hours prior to my evaluation, her vital signs are stable.  She does endorse daily fentanyl use.  She is here for medical clearance prior to going to jail.  Amount and/or Complexity of Data Reviewed Labs:     Details: Not indicated.  She is currently on day 3 of antibiotics for UTI.  She confirms daily fentanyl use.           Final Clinical Impression(s) / ED Diagnoses Final diagnoses:  Foreign body in vagina, initial encounter    Rx / DC Orders ED Discharge Orders     None         Victoriano Lain 09/21/23 2232    Terrilee Files, MD 09/22/23 1002

## 2023-09-21 NOTE — ED Triage Notes (Signed)
Pt brought by [facility] for removal of object in vagina, needs to be done before pt can be taken to jail.
